# Patient Record
Sex: Male | Born: 1970 | Race: White | Hispanic: No | Smoking: Current every day smoker
Health system: Southern US, Community
[De-identification: ages and names within clinical notes are randomized; demographics above are authoritative.]

## PROBLEM LIST (undated history)

## (undated) DIAGNOSIS — Z8739 Personal history of other diseases of the musculoskeletal system and connective tissue: Secondary | ICD-10-CM

## (undated) DIAGNOSIS — F329 Major depressive disorder, single episode, unspecified: Secondary | ICD-10-CM

## (undated) DIAGNOSIS — F32A Depression, unspecified: Secondary | ICD-10-CM

## (undated) HISTORY — PX: BACK SURGERY: SHX140

---

## 1997-11-29 ENCOUNTER — Encounter: Admission: RE | Admit: 1997-11-29 | Discharge: 1997-11-29 | Payer: Self-pay | Admitting: Family Medicine

## 2000-11-18 ENCOUNTER — Encounter: Payer: Self-pay | Admitting: Neurosurgery

## 2000-11-18 ENCOUNTER — Encounter: Admission: RE | Admit: 2000-11-18 | Discharge: 2000-11-18 | Payer: Self-pay | Admitting: Neurosurgery

## 2000-12-02 ENCOUNTER — Encounter: Payer: Self-pay | Admitting: Neurosurgery

## 2000-12-02 ENCOUNTER — Encounter: Admission: RE | Admit: 2000-12-02 | Discharge: 2000-12-02 | Payer: Self-pay | Admitting: Neurosurgery

## 2000-12-16 ENCOUNTER — Encounter: Admission: RE | Admit: 2000-12-16 | Discharge: 2000-12-16 | Payer: Self-pay | Admitting: Neurosurgery

## 2000-12-16 ENCOUNTER — Encounter: Payer: Self-pay | Admitting: Neurosurgery

## 2003-06-19 ENCOUNTER — Other Ambulatory Visit: Payer: Self-pay

## 2004-01-15 ENCOUNTER — Other Ambulatory Visit: Payer: Self-pay

## 2004-02-21 ENCOUNTER — Emergency Department (HOSPITAL_COMMUNITY): Admission: EM | Admit: 2004-02-21 | Discharge: 2004-02-22 | Payer: Self-pay | Admitting: *Deleted

## 2004-05-06 ENCOUNTER — Emergency Department: Payer: Self-pay | Admitting: Emergency Medicine

## 2004-06-13 ENCOUNTER — Encounter: Admission: RE | Admit: 2004-06-13 | Discharge: 2004-06-13 | Payer: Self-pay | Admitting: Neurosurgery

## 2004-06-19 ENCOUNTER — Encounter: Admission: RE | Admit: 2004-06-19 | Discharge: 2004-06-19 | Payer: Self-pay | Admitting: Neurosurgery

## 2004-07-02 ENCOUNTER — Encounter: Admission: RE | Admit: 2004-07-02 | Discharge: 2004-07-02 | Payer: Self-pay | Admitting: Neurosurgery

## 2004-08-08 ENCOUNTER — Ambulatory Visit (HOSPITAL_COMMUNITY): Admission: RE | Admit: 2004-08-08 | Discharge: 2004-08-08 | Payer: Self-pay | Admitting: Neurosurgery

## 2005-03-11 ENCOUNTER — Encounter: Admission: RE | Admit: 2005-03-11 | Discharge: 2005-03-11 | Payer: Self-pay | Admitting: Neurosurgery

## 2006-01-23 ENCOUNTER — Encounter: Admission: RE | Admit: 2006-01-23 | Discharge: 2006-01-23 | Payer: Self-pay | Admitting: Neurosurgery

## 2008-02-21 ENCOUNTER — Emergency Department: Payer: Self-pay | Admitting: Emergency Medicine

## 2008-02-21 ENCOUNTER — Other Ambulatory Visit: Payer: Self-pay

## 2008-10-25 ENCOUNTER — Ambulatory Visit: Payer: Self-pay | Admitting: Family Medicine

## 2008-10-25 DIAGNOSIS — K029 Dental caries, unspecified: Secondary | ICD-10-CM | POA: Insufficient documentation

## 2008-10-25 DIAGNOSIS — K921 Melena: Secondary | ICD-10-CM

## 2008-10-25 DIAGNOSIS — F3289 Other specified depressive episodes: Secondary | ICD-10-CM | POA: Insufficient documentation

## 2008-10-25 DIAGNOSIS — E785 Hyperlipidemia, unspecified: Secondary | ICD-10-CM

## 2008-10-25 DIAGNOSIS — G43009 Migraine without aura, not intractable, without status migrainosus: Secondary | ICD-10-CM | POA: Insufficient documentation

## 2008-10-25 DIAGNOSIS — K649 Unspecified hemorrhoids: Secondary | ICD-10-CM | POA: Insufficient documentation

## 2008-10-25 DIAGNOSIS — I10 Essential (primary) hypertension: Secondary | ICD-10-CM | POA: Insufficient documentation

## 2008-10-25 DIAGNOSIS — M549 Dorsalgia, unspecified: Secondary | ICD-10-CM | POA: Insufficient documentation

## 2008-10-25 DIAGNOSIS — F329 Major depressive disorder, single episode, unspecified: Secondary | ICD-10-CM

## 2008-10-25 DIAGNOSIS — R011 Cardiac murmur, unspecified: Secondary | ICD-10-CM | POA: Insufficient documentation

## 2008-11-15 ENCOUNTER — Ambulatory Visit: Payer: Self-pay | Admitting: Family Medicine

## 2008-11-15 LAB — CONVERTED CEMR LAB
ALT: 28 units/L (ref 0–53)
Alkaline Phosphatase: 76 units/L (ref 39–117)
Calcium: 9 mg/dL (ref 8.4–10.5)
Chloride: 105 meq/L (ref 96–112)
Creatinine, Ser: 0.9 mg/dL (ref 0.4–1.5)
Eosinophils Absolute: 0.2 10*3/uL (ref 0.0–0.7)
Glucose, Bld: 91 mg/dL (ref 70–99)
HDL: 31 mg/dL — ABNORMAL LOW (ref >39.00)
Hemoglobin: 13.4 g/dL (ref 13.0–17.0)
Lymphocytes Relative: 32 % (ref 12.0–46.0)
Monocytes Absolute: 0.6 10*3/uL (ref 0.1–1.0)
Monocytes Relative: 8.2 % (ref 3.0–12.0)
Neutro Abs: 4.2 10*3/uL (ref 1.4–7.7)
Neutrophils Relative %: 56.9 % (ref 43.0–77.0)
Potassium: 3.8 meq/L (ref 3.5–5.1)
RBC: 4.19 M/uL — ABNORMAL LOW (ref 4.22–5.81)
TSH: 1.58 microintl units/mL (ref 0.35–5.50)
Total Bilirubin: 0.4 mg/dL (ref 0.3–1.2)
Total CHOL/HDL Ratio: 5
Total Protein: 6.6 g/dL (ref 6.0–8.3)
VLDL: 31 mg/dL (ref 0.0–40.0)

## 2008-11-19 ENCOUNTER — Ambulatory Visit: Payer: Self-pay | Admitting: Family Medicine

## 2009-07-12 ENCOUNTER — Ambulatory Visit: Payer: Self-pay | Admitting: Family Medicine

## 2009-08-05 ENCOUNTER — Ambulatory Visit: Payer: Self-pay | Admitting: Family Medicine

## 2009-08-05 DIAGNOSIS — IMO0002 Reserved for concepts with insufficient information to code with codable children: Secondary | ICD-10-CM

## 2009-08-09 ENCOUNTER — Telehealth: Payer: Self-pay | Admitting: Family Medicine

## 2009-08-10 ENCOUNTER — Emergency Department (HOSPITAL_COMMUNITY): Admission: EM | Admit: 2009-08-10 | Discharge: 2009-08-10 | Payer: Self-pay | Admitting: Emergency Medicine

## 2009-08-12 ENCOUNTER — Ambulatory Visit: Payer: Self-pay | Admitting: Family Medicine

## 2009-08-13 ENCOUNTER — Encounter: Admission: RE | Admit: 2009-08-13 | Discharge: 2009-08-13 | Payer: Self-pay | Admitting: Family Medicine

## 2009-08-13 LAB — CONVERTED CEMR LAB
BUN: 8 mg/dL (ref 6–23)
Creatinine, Ser: 0.9 mg/dL (ref 0.4–1.5)

## 2009-08-19 ENCOUNTER — Ambulatory Visit: Payer: Self-pay | Admitting: Family Medicine

## 2009-08-22 ENCOUNTER — Encounter: Payer: Self-pay | Admitting: Family Medicine

## 2009-10-01 ENCOUNTER — Emergency Department: Payer: Self-pay | Admitting: Emergency Medicine

## 2010-04-22 ENCOUNTER — Ambulatory Visit: Payer: Self-pay | Admitting: Family Medicine

## 2010-04-22 DIAGNOSIS — K5900 Constipation, unspecified: Secondary | ICD-10-CM | POA: Insufficient documentation

## 2010-04-22 DIAGNOSIS — R109 Unspecified abdominal pain: Secondary | ICD-10-CM | POA: Insufficient documentation

## 2010-04-22 LAB — CONVERTED CEMR LAB
Cholesterol, target level: 200 mg/dL
LDL Goal: 130 mg/dL

## 2010-04-23 ENCOUNTER — Encounter (INDEPENDENT_AMBULATORY_CARE_PROVIDER_SITE_OTHER): Payer: Self-pay | Admitting: *Deleted

## 2010-04-28 LAB — CONVERTED CEMR LAB
ALT: 22 units/L (ref 0–53)
Albumin: 4 g/dL (ref 3.5–5.2)
CO2: 27 meq/L (ref 19–32)
Creatinine, Ser: 0.9 mg/dL (ref 0.4–1.5)
GFR calc non Af Amer: 103.98 mL/min (ref >60)
Glucose, Bld: 85 mg/dL (ref 70–99)
HCT: 36.7 % — ABNORMAL LOW (ref 39.0–52.0)
Lipase: 24 units/L (ref 11.0–59.0)
Monocytes Absolute: 0.6 10*3/uL (ref 0.1–1.0)
Monocytes Relative: 11 % (ref 3.0–12.0)
Neutro Abs: 3.1 10*3/uL (ref 1.4–7.7)
Neutrophils Relative %: 53.9 % (ref 43.0–77.0)
Platelets: 218 10*3/uL (ref 150.0–400.0)
Potassium: 3.9 meq/L (ref 3.5–5.1)
Sodium: 140 meq/L (ref 135–145)
Total Bilirubin: 0.3 mg/dL (ref 0.3–1.2)
Total Protein: 6.7 g/dL (ref 6.0–8.3)

## 2010-05-21 ENCOUNTER — Ambulatory Visit: Payer: Self-pay | Admitting: Family Medicine

## 2010-07-19 ENCOUNTER — Encounter: Payer: Self-pay | Admitting: Neurosurgery

## 2010-07-31 NOTE — Assessment & Plan Note (Signed)
Summary: SINUS INFECTION. CYD   Vital Signs:  Patient profile:   40 year old male Height:      70 inches Weight:      172.0 pounds BMI:     24.77 Temp:     98.8 degrees F oral Pulse rate:   80 / minute Pulse rhythm:   regular BP sitting:   120 / 84  (left arm) Cuff size:   regular  History of Present Illness: Chief complaint sinus infection  Acute Visit History:      The patient complains of cough, headache, and sinus problems.  These symptoms began 1 week ago.  He denies earache and fever.  Other comments include: left face swollen.  Teeth are bad...but no specific oral swelling and pain...feels like sack behind upper lip with fluid from sweling looking into getting dentures Took 500 mg three daily x 2-3 days.        The patient notes shortness of breath.  The character of the cough is described as nonproductive, post nasal drip.  There is no history of wheezing associated with his cough.        He complains of sinus pressure, ears being blocked, nasal congestion, purulent drainage, and epistaxis.  The patient has had a past history of sinusitis.  He denies previous sinus surgery or sinusitis in the last 2 months.        Problems Prior to Update: 1)  Health Maintenance Exam  (ICD-V70.0) 2)  Dental Caries  (ICD-521.00) 3)  Hemorrhoids  (ICD-455.6) 4)  Back Pain  (ICD-724.5) 5)  Family History of Alcoholism/addiction  (ICD-V61.41) 6)  Migraine, Common  (ICD-346.10) 7)  Cardiac Murmur  (ICD-785.2) 8)  Blood in Stool  (ICD-578.1) 9)  Hypertension  (ICD-401.9) 10)  Hyperlipidemia  (ICD-272.4) 11)  Headache  (ICD-784.0) 12)  Depression  (ICD-311)  Current Medications (verified): 1)  Hydrocortisone Acetate 25 Mg Supp (Hydrocortisone Acetate) .... Use Up To 4 Times A Day During Hemorrhoid Flares 2)  Amoxicillin-Pot Clavulanate 875-125 Mg Tabs (Amoxicillin-Pot Clavulanate) .Marland Kitchen.. 1 Tab By Mouth Two Times A Day X 10 Days  Allergies (verified): No Known Drug Allergies  Past  History:  Past medical, surgical, family and social histories (including risk factors) reviewed, and no changes noted (except as noted below).  Past Medical History: Reviewed history from 10/25/2008 and no changes required. MIGRAINE HYPERTENSION HYPERLIPIDEMIA  DEPRESSION Chronic back pain Degenerative Disk Disease Hemorrhoids   Former Dr. Sherrie Mustache patient, [facility]  Past Surgical History: Reviewed history from 10/25/2008 and no changes required. Back Surgery, Partial disectomy, L4 or L5 Epidural steroid injections, several rounds  Family History: Reviewed history from 10/25/2008 and no changes required. Family History of Alcoholism/Addiction Family History of Arthritis Family History Hypertension Family History Lung cancer Family History of Cardiovascular disorder  Social History: Reviewed history from 10/25/2008 and no changes required. Unemployed, plays a lot of online video games Married Current Smoker, > 50 pack year history Alcohol use-yes Regular exercise-no  Review of Systems General:  Denies fatigue and fever. Eyes:  Denies discharge and double vision. ENT:  Complains of postnasal drainage. CV:  Denies chest pain or discomfort. GI:  Denies abdominal pain. GU:  Denies dysuria.  Physical Exam  General:  thin appearing male smells of smoke Head:  B facial pain, ttp over left upper lip and jaw.  Ears:  clear TMs B Nose:  nasal discharge, no mucosal pallor.   Mouth:  MMM, very poor dentiotion, almost all teeth broken off, very tender  over upper gums, swollen Lungs:  Lung sounds tympanic, prolongued exp phase,  no ronchi localized, no rales Heart:  Normal rate and regular rhythm. S1 and S2 normal without gallop, murmur, click, rub or other extra sounds.   Impression & Recommendations:  Problem # 1:  ACUTE MAXILLARY SINUSITIS (ICD-461.0) Antibiotics, mucinex, nasal saline irrigation His updated medication list for this problem includes:    Amoxicillin-pot  Clavulanate 875-125 Mg Tabs (Amoxicillin-pot clavulanate) .Marland Kitchen... 1 tab by mouth two times a day x 10 days  Instructed on treatment. Call if symptoms persist or worsen.   Problem # 2:  DENTAL CARIES (ICD-521.00) Very tender over upper gum anterior..? abcess. Broad antibitoics to treat es well.   Problem # 3:  ? of COPD (ICD-496) Likely given lung exam and smoking history. Counseled on smoking cessation. Rec follow up with primary to eval further.Marland KitchenPFTS etc. No ongoing exacerbation.   Complete Medication List: 1)  Hydrocortisone Acetate 25 Mg Supp (Hydrocortisone acetate) .... Use up to 4 times a day during hemorrhoid flares 2)  Amoxicillin-pot Clavulanate 875-125 Mg Tabs (Amoxicillin-pot clavulanate) .Marland Kitchen.. 1 tab by mouth two times a day x 10 days  Patient Instructions: 1)  MAke follow up appt with Dr. Patsy Lager for further eval of lung function.  2)  MAke appt with dentist if  upper gum swelling not improved...consider [facility] for tooth removal. Prescriptions: AMOXICILLIN-POT CLAVULANATE 875-125 MG TABS (AMOXICILLIN-POT CLAVULANATE) 1 tab by mouth two times a day x 10 days  #20 x 0   Entered and Authorized by:   Kerby Nora MD   Signed by:   Kerby Nora MD on 07/12/2009   Method used:   Electronically to        [facility]  [address] (retail)       [address]       [place], [place]  [address]       Ph: [phone] or [phone]       Fax: [phone]   RxID:   [id number]   Current Allergies (reviewed today): No known allergies

## 2010-07-31 NOTE — Assessment & Plan Note (Signed)
Summary: depression and back pain   Vital Signs:  Patient profile:   40 year old male Weight:      172.38 pounds BMI:     24.82 Temp:     98.2 degrees F oral Pulse rate:   80 / minute Pulse rhythm:   regular BP sitting:   120 / 80  (left arm) Cuff size:   regular  Vitals Entered By: Linde Gillis CMA Duncan Dull) (August 05, 2009 10:39 AM) CC: referral, depression, Depression   History of Present Illness: 40 year old male:  Still having a lot of problems with his, will have some numbness or tingling in both legs, will completely numb. Essentially intractable pain s/p L4-5 partial diskectomy s/p rounds of epidural injections. Was lost to NSG follow-up.  Dr. Dutch Quint did surgery initially.   Back is constantly bothering him, has been looking for a job, having problems with his wife, does not want to have problems with his wife. Having trouble doing things with his kids. Sleep will be really variable. Some crying spells.     Depression History:      The patient is having a depressed mood most of the day and has a diminished interest in his usual daily activities.  Positive alarm features for depression include insomnia, psychomotor retardation, fatigue (loss of energy), feelings of worthlessness (guilt), and recurrent thoughts of death or suicide.  However, he denies significant weight loss, significant weight gain, hypersomnia, and psychomotor agitation.  The patient denies symptoms of a manic disorder including persistently & abnormally elevated mood, abnormally & persistently irritable mood, less need for sleep, talkative or feels need to keep talking, distractibility, flight of ideas, increase in goal-directed activity, psychomotor agitation, inflated self-esteem or grandiosity, excessive buying sprees, excessive sexual indiscretions, and excessive foolish business investments.        Psychosocial stress factors include major life changes.  Risk factors for depression include chronic illness.   Suicide risk questions reveal that he has thought about ending his life.  The patient denies that he feels like life is not worth living, denies that he wishes that he were dead, and denies that he has planned how to end his life.  His current symptoms often keep him from doing the things he needs to do.        Comments:  few fleeting thoughts of SI, none now.  Allergies (verified): No Known Drug Allergies  Past History:  Past medical, surgical, family and social histories (including risk factors) reviewed, and no changes noted (except as noted below).  Past Medical History: Reviewed history from 10/25/2008 and no changes required. MIGRAINE HYPERTENSION HYPERLIPIDEMIA  DEPRESSION Chronic back pain Degenerative Disk Disease Hemorrhoids   Former Dr. Sherrie Mustache patient, [facility]  Past Surgical History: Reviewed history from 10/25/2008 and no changes required. Back Surgery, Partial disectomy, L4 or L5 Epidural steroid injections, several rounds  Family History: Reviewed history from 10/25/2008 and no changes required. Family History of Alcoholism/Addiction Family History of Arthritis Family History Hypertension Family History Lung cancer Family History of Cardiovascular disorder  Social History: Reviewed history from 10/25/2008 and no changes required. Unemployed, plays a lot of online video games Married Current Smoker, > 50 pack year history Alcohol use-yes Regular exercise-no  Review of Systems       ROS above no acute fevers, chills, sweats, n, v, d, cough, or uri signs   Physical Exam  General:  Well-developed,well-nourished,in no acute distress; alert,appropriate and cooperative throughout examination Head:  Normocephalic and atraumatic  without obvious abnormalities. No apparent alopecia or balding. Ears:  External ear exam shows no significant lesions or deformities.  Otoscopic examination reveals clear canals, tympanic membranes are intact bilaterally without bulging,  retraction, inflammation or discharge. Hearing is grossly normal bilaterally. Nose:  no external deformity.   Mouth:  MMM, very poor dentiotion, almost all teeth broken off Neck:  No deformities, masses, or tenderness noted. Lungs:  Normal respiratory effort, chest expands symmetrically. Lungs are clear to auscultation, no crackles or wheezes. Heart:  Normal rate and regular rhythm. S1 and S2 normal without gallop, murmur, click, rub or other extra sounds.   Impression & Recommendations:  Problem # 1:  DEPRESSION (ICD-311) Assessment Deteriorated >25 minutes spent in face to face time with patient, >50% spent in counselling or coordination of care: decompensated, unemployed, money issues, problems with wife. Chronic back pain, depressed much of this time and crying intermittently. Fleeting thoughts of SI, but none now. Discussed social stressors in much detial. Start SSRI, for now benzo to help acutely with close f/u. Recheck in 2 weeks  His updated medication list for this problem includes:    Citalopram Hydrobromide 20 Mg Tabs (Citalopram hydrobromide) .Marland Kitchen... 1 by mouth daily    Clonazepam 0.5 Mg Tbdp (Clonazepam) .Marland Kitchen... 1 by mouth two times a day as needed anxiety  Problem # 2:  LUMBAR RADICULOPATHY (ICD-724.4) Assessment: Deteriorated Chronic symptoms, anatomy needs to be evaluated. Will ask Dr. Ethelene Browns group to see patient and will obtain an MRI with contrast if they agree to see him.  If not, will refer to [facility] I do not expect adequate response to conservative management in this case.  Orders: Neurosurgeon Referral (Neurosurgeon)  Problem # 3:  BACK PAIN (ICD-724.5)  Orders: Neurosurgeon Referral (Neurosurgeon)  Complete Medication List: 1)  Hydrocortisone Acetate 25 Mg Supp (Hydrocortisone acetate) .... Use up to 4 times a day during hemorrhoid flares 2)  Citalopram Hydrobromide 20 Mg Tabs (Citalopram hydrobromide) .Marland Kitchen.. 1 by mouth daily 3)  Clonazepam 0.5 Mg Tbdp (Clonazepam)  .Marland Kitchen.. 1 by mouth two times a day as needed anxiety  Patient Instructions: 1)  follow-up with me in 2 weeks 2)  Referral Appointment Information 3)  Day/Date: 4)  Time: 5)  Place/MD: 6)  Address: 7)  Phone/Fax: 8)  Patient given appointment information. Information/Orders faxed/mailed.  Prescriptions: CLONAZEPAM 0.5 MG TBDP (CLONAZEPAM) 1 by mouth two times a day as needed anxiety  #30 x 0   Entered and Authorized by:   Hannah Beat MD   Signed by:   Hannah Beat MD on 08/05/2009   Method used:   Print then Give to Patient   RxID:   [id number] CITALOPRAM HYDROBROMIDE 20 MG TABS (CITALOPRAM HYDROBROMIDE) 1 by mouth daily  #30 x 3   Entered and Authorized by:   Hannah Beat MD   Signed by:   Hannah Beat MD on 08/05/2009   Method used:   Print then Give to Patient   RxID:   [id number]   Current Allergies (reviewed today): No known allergies

## 2010-07-31 NOTE — Assessment & Plan Note (Signed)
Summary: NOT EATING AND FEET AND KNEES SWELLING/DLO   Vital Signs:  Patient profile:   40 year old male Height:      70 inches Weight:      177 pounds BMI:     25.49 Temp:     98 degrees F oral Pulse rate:   76 / minute Pulse rhythm:   regular BP sitting:   116 / 84  (left arm) Cuff size:   regular  Vitals Entered By: Delilah Shan CMA  Dull) (April 22, 2010 9:17 AM) CC: Not eating; Feet & Knees swelling, Lipid Management   History of Present Illness: "My mom was asking me to come in."  "I had a week w/o an appetite.  I would still eat but I didn't have a bowel movement until Sunday.  It's like everything went at once."  That was about 1.5 weeks ago.  Since then, BMs have been more regular.  Now eating 1-2 times a day.  Has some tightness on the R side of the abdominal wall - "it feels like a pulled muscle."  No trauma to explain the pain.  More often at night.  Not losing weight. H/o heartburn.    H/o blood in stool with known hemorrhoid.  BRBPR.  No vomiting.  No nausea.    H/o bilateral lower extremity edema about 2 years ago.  "It's not as bad yet."  Prev when it happened, it resolved by the time the patient got to the clinic.  H/o bursitis in bilateral knees.  No knee pain.  "But I'm putting on weight and that may be it."    Taking ASA daily (81mg ) and occ taking excedrin migraine (with ASA in it).    Minimal alcohol per patient.    Allergies: No Known Drug Allergies  Past History:  Past Medical History: MIGRAINE HYPERTENSION HYPERLIPIDEMIA  DEPRESSION Chronic back pain Degenerative Disk Disease Hemorrhoids Former Dr. Sherrie Mustache patient, [facility]  Review of Systems       See HPI.  Otherwise negative.    Physical Exam  General:  RUE:AVWU appearing, nad, alert and oriented HEENT: mucous membranes moist, poor dentition NECK: supple w/o LA CV: rrr.  no murmur PULM: ctab, no inc wob ABD: soft, +bs, R hemiabd tender to palpation but this is reproduced on rectus  testing. no rebound EXT: no edema, knees w/o edema or tenderness SKIN: no acute rash  Rectal with hemorrhoid noted externally.    Impression & Recommendations:  Problem # 1:  ABDOMINAL PAIN (ICD-789.00) I think this is likely a combination of resolved constipation and abdominal wall strain.  See notes on labs.  Orders: TLB-BMP (Basic Metabolic Panel-BMET) (80048-METABOL) TLB-Hepatic/Liver Function Pnl (80076-HEPATIC) TLB-Amylase (82150-AMYL) TLB-Lipase (83690-LIPASE)  Problem # 2:  BLOOD IN STOOL (ICD-578.1) See notes on labs.  Orders: TLB-CBC Platelet - w/Differential (85025-CBCD)  Problem # 3:  CONSTIPATION (ICD-564.00) resolved.   Complete Medication List: 1)  Aspirin 81 Mg Tabs (Aspirin) .... Take 1 tablet by mouth once a day 2)  Tylenol 325 Mg Tabs (Acetaminophen) .... 2 by mouth three times a day as needed for pain 3)  [facility] Omeprazole 20 Mg Tbec (Omeprazole) .Marland Kitchen.. 1 by mouth qday  Other Orders: Admin 1st Vaccine (98119) Flu Vaccine 21yrs + (14782)  Patient Instructions: 1)  You may have stomach irritation due to the aspirin.  I would cut down on the aspirin and take omeprazole (1 a day) in the meantime.  You can get your results through our phone system.  Follow  the instructions on the blue card.  I think you have a pulled muscle on the right side of your stomach.  Let me know if you continue to have symptoms.  Take care.    Orders Added: 1)  Admin 1st Vaccine [90471] 2)  Flu Vaccine 69yrs + [90658] 3)  Est. Patient Level III [[address]] 4)  TLB-CBC Platelet - w/Differential [85025-CBCD] 5)  TLB-BMP (Basic Metabolic Panel-BMET) [80048-METABOL] 6)  TLB-Hepatic/Liver Function Pnl [80076-HEPATIC] 7)  TLB-Amylase [82150-AMYL] 8)  TLB-Lipase [83690-LIPASE]    Current Allergies (reviewed today): No known allergies Flu Vaccine Consent Questions     Do you have a history of severe allergic reactions to this vaccine? no    Any prior history of allergic reactions to egg  and/or gelatin? no    Do you have a sensitivity to the preservative Thimersol? no    Do you have a past history of Guillan-Barre Syndrome? no    Do you currently have an acute febrile illness? no    Have you ever had a severe reaction to latex? no    Vaccine information given and explained to patient? yes    Are you currently pregnant? no    Lot Number:[id number]   Exp Date:12/27/2010   Site Given  Left Deltoid IMes (reviewed today): No known allergies          .lbflu

## 2010-07-31 NOTE — Assessment & Plan Note (Signed)
Summary: 2 WEEK FOLLOW UP/[facility]   Vital Signs:  Patient profile:   40 year old male Height:      70 inches Weight:      174.4 pounds BMI:     25.11 Temp:     98.2 degrees F oral Pulse rate:   80 / minute Pulse rhythm:   regular BP sitting:   120 / 70  (left arm) Cuff size:   regular  Vitals Entered By: Benny Lennert CMA Duncan Dull) (August 19, 2009 8:59 AM)  History of Present Illness: Chief complaint 2 week followup  f/u Depression:  back - Dr. Dutch Quint  currently, the patient states that he is feeling okay. Proximally 3 days after I initially saw him a couple of weeks ago, on a Friday night, the patient was drinking some alcohol,, so only took a Klonopin,  and  eventually  he took the remainder of his  bottle of Klonopin. He tells me that he had no interest in committing suicide, and does not really understand  how this happened.  He was taken to the ER, he'll there for a number of hours, but he was discharged.  took bottle of Klonopin.  Had drank 2 beers, then took a whole bottle  mood is 200% better now.   currently he denies any suicidality or homicidality.  Allergies (verified): No Known Drug Allergies  Past History:  Past medical, surgical, family and social histories (including risk factors) reviewed, and no changes noted (except as noted below).  Past Medical History: Reviewed history from 10/25/2008 and no changes required. MIGRAINE HYPERTENSION HYPERLIPIDEMIA  DEPRESSION Chronic back pain Degenerative Disk Disease Hemorrhoids   Former Dr. Sherrie Mustache patient, [facility]  Past Surgical History: Reviewed history from 10/25/2008 and no changes required. Back Surgery, Partial disectomy, L4 or L5 Epidural steroid injections, several rounds  Family History: Reviewed history from 10/25/2008 and no changes required. Family History of Alcoholism/Addiction Family History of Arthritis Family History Hypertension Family History Lung cancer Family History of Cardiovascular  disorder  Social History: Reviewed history from 10/25/2008 and no changes required. Unemployed, plays a lot of online video games Married Current Smoker, > 50 pack year history Alcohol use-yes Regular exercise-no   Impression & Recommendations:  Problem # 1:  DEPRESSION (ICD-311) >25 minutes spent in face to face time with patient, >50% spent in counselling or coordination of care: I find this history to be highly concerning. He is currently not suicidal or homicidal, and is not committable.  He is still depressed, though he is better. This patient is not forthcoming  in discussion, and I am highly concerned about him. I have arranged for him to see Dr. Imogene Burn in follow-up on Thursday, and I think psychiatric involvement in his care is needed. He is feeling a little better now, more likely due to CItalopram effect at 2 weeks, but this patient's ability to fully express his moods to me I think is not fully there.  The following medications were removed from the medication list:    Clonazepam 0.5 Mg Tbdp (Clonazepam) .Marland Kitchen... 1 by mouth two times a day as needed anxiety His updated medication list for this problem includes:    Citalopram Hydrobromide 20 Mg Tabs (Citalopram hydrobromide) .Marland Kitchen... 1 by mouth daily  Complete Medication List: 1)  Hydrocortisone Acetate 25 Mg Supp (Hydrocortisone acetate) .... Use up to 4 times a day during hemorrhoid flares 2)  Citalopram Hydrobromide 20 Mg Tabs (Citalopram hydrobromide) .Marland Kitchen.. 1 by mouth daily 3)  Vicodin 5-500 Mg  Tabs (Hydrocodone-acetaminophen) .Marland Kitchen.. 1-2 every 4-6 hours 4)  Corisodoldol  5)  Prednisone (pak) 10 Mg Tabs (Prednisone) .... Tapper  Other Orders: Psychiatric Referral (Psych)  Patient Instructions: 1)  Referral Appointment Information 2)  Day/Date: 3)  Time: 4)  Place/MD: 5)  Address: 6)  Phone/Fax: 7)  Patient given appointment information. Information/Orders faxed/mailed.   Current Allergies (reviewed today): No known  allergies

## 2010-07-31 NOTE — Progress Notes (Signed)
  Phone Note Call from Patient   Caller: Patient Summary of Call: Patient called to say that he has an appt with Dr Dutch Quint on Thursday 08/15/2009 at 10:45am. Dr Shirlean Mylar office asked the patient to see if you could schedule the patient for an MRI before his appt on thursday. Told him we would call him back when Dr Faline Langer is back in the office on monday 08/12/2009 that Dr Patsy Lager had already said he would order this test if they needed him to. Initial call taken by: Butch Adam,  August 09, 2009 4:44 PM  Follow-up for Phone Call        I agree that this needs to be evaluated with MRI prior to neurosurgical evaluation with clear radiculopathy, neuro findings, and prior spine surgery.   Follow-up by: Hannah Beat MD,  August 11, 2009 6:23 PM

## 2010-07-31 NOTE — Letter (Signed)
Summary: Generic Letter  [place] [facility]  [address] [place], [place] [address]   Phone: [phone]  Fax: [phone]    04/23/2010    Noah Avery [address] [place], [place]  [address]    Dear Mr. Puff,  We have been unable to reach you by phone.  If your phone number has changed, please notify our office as it is important that we be able to contact  you if necessary.   All  of your labs are fine except for mild decrease in blood counts.  I would take the Omeprazole, decrease the asprin, and recheck your blood in 1 month.  Let me know if you have further symptoms in the meantime.    I think that with the Omeprazole and resolved conspitation, your  symptoms will improve.        Sincerely,   Dwana Curd. Para March, M.D.  [facility]

## 2010-07-31 NOTE — Consult Note (Signed)
Summary: Genevieve Norlander MD  Genevieve Norlander MD   Imported By: Lanelle Bal 08/28/2009 12:20:13  _____________________________________________________________________  External Attachment:    Type:   Image     Comment:   External Document

## 2010-09-17 LAB — BASIC METABOLIC PANEL
CO2: 26 mEq/L (ref 19–32)
Creatinine, Ser: 0.79 mg/dL (ref 0.4–1.5)
GFR calc non Af Amer: >60 mL/min (ref >60)
Glucose, Bld: 84 mg/dL (ref 70–99)
Potassium: 3.6 mEq/L (ref 3.5–5.1)

## 2010-09-17 LAB — CBC
HCT: 41.2 % (ref 39.0–52.0)
MCHC: 34.1 g/dL (ref 30.0–36.0)
Platelets: 176 10*3/uL (ref 150–400)
RBC: 4.53 MIL/uL (ref 4.22–5.81)
WBC: 8.7 10*3/uL (ref 4.0–10.5)

## 2010-09-17 LAB — RAPID URINE DRUG SCREEN, HOSP PERFORMED
Amphetamines: NOT DETECTED
Barbiturates: NOT DETECTED
Benzodiazepines: NOT DETECTED
Cocaine: NOT DETECTED
Opiates: NOT DETECTED

## 2010-09-17 LAB — DIFFERENTIAL
Basophils Relative: 1 % (ref 0–1)
Eosinophils Absolute: 0.2 10*3/uL (ref 0.0–0.7)
Lymphocytes Relative: 37 % (ref 12–46)
Monocytes Absolute: 0.5 10*3/uL (ref 0.1–1.0)
Neutro Abs: 4.7 10*3/uL (ref 1.7–7.7)

## 2010-09-17 LAB — ACETAMINOPHEN LEVEL: Acetaminophen (Tylenol), Serum: <10 ug/mL — ABNORMAL LOW (ref 10–30)

## 2010-09-17 LAB — SALICYLATE LEVEL: Salicylate Lvl: <4 mg/dL (ref 2.8–20.0)

## 2010-09-17 LAB — ETHANOL: Alcohol, Ethyl (B): 154 mg/dL — ABNORMAL HIGH (ref 0–10)

## 2010-11-14 NOTE — Op Note (Signed)
NAMEKASSEM, KIBBE NO.:  [id number]   MEDICAL RECORD NO.:  [id number]          PATIENT TYPE:  OIB   LOCATION:  3038                         FACILITY:  [facility]   PHYSICIAN:  Kathaleen Maser. Pool, M.D.    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   PROCEDURE:  Right L4-5 laminotomy and microdiscectomy.   SURGEON:  Kathaleen Maser. Pool, M.D.   ASSISTANT:  Donalee Citrin, M.D.   ANESTHESIA:  General anesthesia.   INDICATIONS FOR PROCEDURE:  Mr. Ehle is a 40 year old male with history of  back and bilateral lower extremity pain, alternating between the left and  right.  Patient has failed conservative management.  Work-up demonstrates  evidence of a significant right paracentral disc herniation at L4-5.  We  discussed options of operative management including the possibility to  undergoing a right-sided L4-5 laminotomy and microdiscectomy in hopes of  improving his symptoms.  The patient is aware of the risks and benefits and  wishes to proceed.   Preoperatively the patient's films are not available for review while the  patient was in the office and incorrectly something had been transposed and  we initially talked about doing the procedure on the left side.  This error  was corrected prior to the day of surgery.  The patient fully understood  that we were going in from the right side and had been consented  appropriately.   DESCRIPTION OF PROCEDURE:  The patient taken to the operating room and  placed on the table in the supine position.  Local anesthesia achieved.  Patient was positioned prone on the Wilson frame, appropriately padded with  _________ prepped and draped sterilely.  ________ linear skin incision  overlying the L4-5 interspace.  This was carried down sharply in the  midline.  Subperiosteal dissection then performed to the right side,  exposing the laminar facet joints at L4 and L5.  Deep self-retaining  retractor placed.  Intraoperative x-ray taken.  Level was confirmed.  Laminotomy was then performed using high speed drill and Kerrison rongeurs  to remove the inferior aspect of the lamina of L4, medial aspect of the L4-5  facet joint and superior rim of the L5 lamina.  Ligamentum flavum was then  elevated and resected piecemeal fashion using Kerrison rongeurs.  Underlying  thecal sac and exiting L5 nerve root were identified.  Microscope was  brought into the field using microdissection of the right sided L5 nerve  root and underlying disc herniation.  The epineural venous plexus coagulated  and cut.  Thecal sac and L5 nerve root were protected.  Disc space was then  incised in a rectangular fashion using a 15 blade.  A wide disc space clean  out was then achieved using pituitary rongeurs, upper angled pituitary  rongeurs and Epstein curettes to fully remove the disc herniation as  well as  any loose or obviously degenerative disc disease from within the interspace.  After a very thorough discectomy had been performed, the spinal canal was  inspected.  There was no evidence of injury to thecal sac and nerve roots.  There was no evidence of any residual compression to the thecal sac or nerve  roots.  Wound was then irrigated in _________ solution.  Gelfoam was placed  topically for hemostasis and found to be good.  Microscope and retraction system were removed.  Hemostasis was achieved with  electrocautery.  Wound was closed in layers with Vicryl sutures, Steri-  Strips and sterile dressing were applied.  There were no apparent  complications.  The patient tolerated the procedure well and he returns to  the recovery room postoperatively.      HAP/MEDQ  D:  08/08/2004  T:   08/08/2004  Job:  [id number]

## 2012-03-08 ENCOUNTER — Emergency Department: Payer: Self-pay | Admitting: Unknown Physician Specialty

## 2012-03-08 LAB — CBC
HGB: 14.4 g/dL (ref 13.0–18.0)
MCH: 30.9 pg (ref 26.0–34.0)
MCHC: 33.7 g/dL (ref 32.0–36.0)
Platelet: 204 10*3/uL (ref 150–440)
RBC: 4.67 10*6/uL (ref 4.40–5.90)

## 2012-03-08 LAB — TSH: Thyroid Stimulating Horm: 1.27 u[IU]/mL

## 2012-03-08 LAB — BASIC METABOLIC PANEL
Calcium, Total: 8.9 mg/dL (ref 8.5–10.1)
Chloride: 107 mmol/L (ref 98–107)
EGFR (Non-African Amer.): >60
Glucose: 99 mg/dL (ref 65–99)
Potassium: 3.7 mmol/L (ref 3.5–5.1)
Sodium: 142 mmol/L (ref 136–145)

## 2012-03-08 LAB — HEPATIC FUNCTION PANEL A (ARMC): SGOT(AST): 25 U/L (ref 15–37)

## 2012-03-08 LAB — TROPONIN I: Troponin-I: <0.02 ng/mL

## 2014-01-10 ENCOUNTER — Emergency Department: Payer: Self-pay | Admitting: Emergency Medicine

## 2015-03-21 ENCOUNTER — Emergency Department
Admission: EM | Admit: 2015-03-21 | Discharge: 2015-03-22 | Discharge status: Against Medical Advice | Payer: Self-pay | Attending: Emergency Medicine | Admitting: Emergency Medicine

## 2015-03-21 ENCOUNTER — Encounter: Payer: Self-pay | Admitting: *Deleted

## 2015-03-21 ENCOUNTER — Emergency Department: Payer: Self-pay

## 2015-03-21 DIAGNOSIS — Z720 Tobacco use: Secondary | ICD-10-CM | POA: Insufficient documentation

## 2015-03-21 DIAGNOSIS — R0602 Shortness of breath: Secondary | ICD-10-CM | POA: Insufficient documentation

## 2015-03-21 DIAGNOSIS — R05 Cough: Secondary | ICD-10-CM | POA: Insufficient documentation

## 2015-03-21 DIAGNOSIS — R509 Fever, unspecified: Secondary | ICD-10-CM | POA: Insufficient documentation

## 2015-03-21 HISTORY — DX: Personal history of other diseases of the musculoskeletal system and connective tissue: Z87.39

## 2015-03-21 NOTE — ED Notes (Signed)
Pt reports cough and some shortness of breath x 1 week. States started out as sinus infection. Productive cough, low grade fever. Here with mother who has similar symptoms.

## 2016-01-04 ENCOUNTER — Emergency Department: Payer: Self-pay

## 2016-01-04 ENCOUNTER — Encounter: Payer: Self-pay | Admitting: Emergency Medicine

## 2016-01-04 ENCOUNTER — Emergency Department
Admission: EM | Admit: 2016-01-04 | Discharge: 2016-01-06 | Disposition: A | Discharge status: Home / Self Care | Payer: Self-pay | Attending: Emergency Medicine | Admitting: Emergency Medicine

## 2016-01-04 DIAGNOSIS — E785 Hyperlipidemia, unspecified: Secondary | ICD-10-CM | POA: Insufficient documentation

## 2016-01-04 DIAGNOSIS — F332 Major depressive disorder, recurrent severe without psychotic features: Secondary | ICD-10-CM | POA: Diagnosis present

## 2016-01-04 DIAGNOSIS — F32A Depression, unspecified: Secondary | ICD-10-CM

## 2016-01-04 DIAGNOSIS — R51 Headache: Secondary | ICD-10-CM | POA: Insufficient documentation

## 2016-01-04 DIAGNOSIS — F17200 Nicotine dependence, unspecified, uncomplicated: Secondary | ICD-10-CM | POA: Diagnosis present

## 2016-01-04 DIAGNOSIS — I10 Essential (primary) hypertension: Secondary | ICD-10-CM | POA: Insufficient documentation

## 2016-01-04 DIAGNOSIS — F329 Major depressive disorder, single episode, unspecified: Secondary | ICD-10-CM | POA: Insufficient documentation

## 2016-01-04 DIAGNOSIS — Z5181 Encounter for therapeutic drug level monitoring: Secondary | ICD-10-CM | POA: Insufficient documentation

## 2016-01-04 DIAGNOSIS — R45851 Suicidal ideations: Secondary | ICD-10-CM | POA: Insufficient documentation

## 2016-01-04 HISTORY — DX: Depression, unspecified: F32.A

## 2016-01-04 HISTORY — DX: Major depressive disorder, single episode, unspecified: F32.9

## 2016-01-04 LAB — CBC
HCT: 41.1 % (ref 40.0–52.0)
Hemoglobin: 14.6 g/dL (ref 13.0–18.0)
MCH: 31.1 pg (ref 26.0–34.0)
MCHC: 35.5 g/dL (ref 32.0–36.0)
MCV: 87.7 fL (ref 80.0–100.0)
PLATELETS: 226 10*3/uL (ref 150–440)
RBC: 4.68 MIL/uL (ref 4.40–5.90)
RDW: 13.8 % (ref 11.5–14.5)
WBC: 10.1 10*3/uL (ref 3.8–10.6)

## 2016-01-04 LAB — COMPREHENSIVE METABOLIC PANEL
ALBUMIN: 4.5 g/dL (ref 3.5–5.0)
ALT: 17 U/L (ref 17–63)
AST: 32 U/L (ref 15–41)
Alkaline Phosphatase: 88 U/L (ref 38–126)
Anion gap: 12 (ref 5–15)
BUN: 11 mg/dL (ref 6–20)
CHLORIDE: 99 mmol/L — AB (ref 101–111)
CO2: 20 mmol/L — ABNORMAL LOW (ref 22–32)
CREATININE: 1.38 mg/dL — AB (ref 0.61–1.24)
Calcium: 9.2 mg/dL (ref 8.9–10.3)
Glucose, Bld: 133 mg/dL — ABNORMAL HIGH (ref 65–99)
POTASSIUM: 3.6 mmol/L (ref 3.5–5.1)
SODIUM: 131 mmol/L — AB (ref 135–145)
TOTAL PROTEIN: 7.8 g/dL (ref 6.5–8.1)
Total Bilirubin: 0.4 mg/dL (ref 0.3–1.2)

## 2016-01-04 LAB — ACETAMINOPHEN LEVEL: Acetaminophen (Tylenol), Serum: <10 ug/mL — ABNORMAL LOW (ref 10–30)

## 2016-01-04 LAB — URINE DRUG SCREEN, QUALITATIVE (ARMC ONLY)
Amphetamines, Ur Screen: NOT DETECTED
BENZODIAZEPINE, UR SCRN: NOT DETECTED
Barbiturates, Ur Screen: NOT DETECTED
CANNABINOID 50 NG, UR ~~LOC~~: NOT DETECTED
Cocaine Metabolite,Ur ~~LOC~~: NOT DETECTED
MDMA (ECSTASY) UR SCREEN: NOT DETECTED
Methadone Scn, Ur: NOT DETECTED
Opiate, Ur Screen: NOT DETECTED
Phencyclidine (PCP) Ur S: NOT DETECTED
TRICYCLIC, UR SCREEN: POSITIVE — AB

## 2016-01-04 LAB — ETHANOL

## 2016-01-04 LAB — SALICYLATE LEVEL

## 2016-01-04 MED ORDER — MIRTAZAPINE 15 MG PO TABS
15.0000 mg | ORAL_TABLET | Freq: Every day | ORAL | Status: DC
  Administered 2016-01-04 – 2016-01-05 (×2): 15 mg via ORAL
  Filled 2016-01-04 (×2): qty 1

## 2016-01-04 MED ORDER — NICOTINE 14 MG/24HR TD PT24
14.0000 mg | MEDICATED_PATCH | Freq: Once | TRANSDERMAL | Status: DC
  Filled 2016-01-04 (×2): qty 1

## 2016-01-04 MED ORDER — TRAZODONE HCL 100 MG PO TABS
100.0000 mg | ORAL_TABLET | Freq: Every day | ORAL | Status: DC
  Administered 2016-01-04 – 2016-01-05 (×2): 100 mg via ORAL
  Filled 2016-01-04 (×2): qty 1

## 2016-01-04 MED ORDER — NICOTINE 21 MG/24HR TD PT24
21.0000 mg | MEDICATED_PATCH | Freq: Every day | TRANSDERMAL | Status: DC
  Administered 2016-01-05 – 2016-01-06 (×2): 21 mg via TRANSDERMAL
  Filled 2016-01-04: qty 1

## 2016-01-04 NOTE — ED Notes (Signed)
Patient states has had some thoughts of harming self, states does not have a plan and has not done anything to this point to harm self.

## 2016-01-04 NOTE — BH Assessment (Signed)
Assessment Note  Noah Avery is an 45 y.o. male. Patient presents to the ED because of suicidal thoughts with a plan.  Patient continues to endorse suicidal thoughts with plan to jump in near by river with intent to drown since he can not swim. "I don't know what I would do if I am left alone".    Patient denies past history of suicidal gestures although previously sent to ED for an alleged overdose but it was a medication reaction.  He reports current symptoms are contributed by family conflicts, being the primary care giver for mother, and financial.  Patient denies homicidal ideations, A/VH, and other self-injurious behaviors.  Patient denies current substance abuse.      Diagnosis: Adjustment Disorder, depressed  Past Medical History:  Past Medical History  Diagnosis Date  . H/O degenerative disc disease   . Depression     Past Surgical History  Procedure Laterality Date  . Back surgery      Family History: History reviewed. No pertinent family history.  Social History:  reports that he has been smoking.  He does not have any smokeless tobacco history on file. He reports that he does not drink alcohol or use illicit drugs.  Additional Social History:  Alcohol / Drug Use Pain Medications: see chart  Prescriptions: see chart Over the Counter: see chart History of alcohol / drug use?:  (pt denies abuse)  CIWA: CIWA-Ar BP: (!) 143/96 mmHg Pulse Rate: (!) 129 COWS:    Allergies:  Allergies  Allergen Reactions  . Silver Nitrate     Home Medications:  (Not in a hospital admission)  OB/GYN Status:  No LMP for male patient.  General Assessment Data Location of Assessment: [facility] ED TTS Assessment: In system Is this a Tele or Face-to-Face Assessment?: Face-to-Face Is this an Initial Assessment or a Re-assessment for this encounter?: Initial Assessment Marital status: Divorced Is patient pregnant?: No Pregnancy Status: No Living Arrangements: Parent Can pt return to  current living arrangement?: Yes Admission Status: Voluntary Is patient capable of signing voluntary admission?: Yes Referral Source: Self/Family/Friend Insurance type: na  Medical Screening Exam [facility] Walk-in ONLY) Medical Exam completed: Yes  Crisis Care Plan Living Arrangements: Parent Name of Psychiatrist: none Name of Therapist: none  Education Status Is patient currently in school?: No  Risk to self with the past 6 months Suicidal Ideation: Yes-Currently Present Has patient been a risk to self within the past 6 months prior to admission? : No Suicidal Intent: Yes-Currently Present Has patient had any suicidal intent within the past 6 months prior to admission? : No Is patient at risk for suicide?: Yes Suicidal Plan?: Yes-Currently Present Has patient had any suicidal plan within the past 6 months prior to admission? : No Specify Current Suicidal Plan: Jump in river b/o not being able to swim Access to Means: Yes Specify Access to Suicidal Means: specific river What has been your use of drugs/alcohol within the last 12 months?: Pt denies Previous Attempts/Gestures: No How many times?: 0 Other Self Harm Risks: na Intentional Self Injurious Behavior: None Family Suicide History: Unknown Recent stressful life event(s): Conflict (Comment), Loss (Comment), Financial Problems Persecutory voices/beliefs?: No Depression: Yes Depression Symptoms: Guilt, Feeling angry/irritable, Feeling worthless/self pity, Loss of interest in usual pleasures Substance abuse history and/or treatment for substance abuse?: No  Risk to Others within the past 6 months Homicidal Ideation: No-Not Currently/Within Last 6 Months Does patient have any lifetime risk of violence toward others beyond the six months  prior to admission? : No Thoughts of Harm to Others: No-Not Currently Present/Within Last 6 Months Current Homicidal Intent: No-Not Currently/Within Last 6 Months Current Homicidal Plan: No-Not  Currently/Within Last 6 Months Access to Homicidal Means: No History of harm to others?: No Assessment of Violence: None Noted Does patient have access to weapons?: No Criminal Charges Pending?: No Does patient have a court date: No Is patient on probation?: No  Psychosis Hallucinations: None noted Delusions: None noted  Mental Status Report Appearance/Hygiene: In scrubs Eye Contact: Good Motor Activity: Freedom of movement Speech: Logical/coherent Level of Consciousness: Alert Mood: Anxious Affect: Anxious Anxiety Level: Minimal Thought Processes: Relevant Judgement: Partial Orientation: Person, Place, Time, Situation, Appropriate for developmental age Obsessive Compulsive Thoughts/Behaviors: None  Cognitive Functioning Concentration: Fair Memory: Recent Intact, Remote Intact IQ: Average Insight: Fair Impulse Control: Fair Appetite: Fair Weight Loss: 0 Weight Gain: 0 Sleep: Decreased Total Hours of Sleep: 4 Vegetative Symptoms: None  ADLScreening [facility]) Patient's cognitive ability adequate to safely complete daily activities?: Yes Patient able to express need for assistance with ADLs?: Yes Independently performs ADLs?: Yes (appropriate for developmental age)  Prior Inpatient Therapy Prior Inpatient Therapy: Yes Prior Therapy Dates: 2008 Prior Therapy Facilty/Provider(s): unknown Reason for Treatment: potential overdose  Prior Outpatient Therapy Prior Outpatient Therapy: No Does patient have an ACCT team?: No Does patient have Intensive In-House Services?  : No Does patient have [facility] services? : No Does patient have P4CC services?: No  ADL Screening (condition at time of admission) Patient's cognitive ability adequate to safely complete daily activities?: Yes Patient able to express need for assistance with ADLs?: Yes Independently performs ADLs?: Yes (appropriate for developmental age)       Abuse/Neglect Assessment (Assessment  to be complete while patient is alone) Physical Abuse: Denies Verbal Abuse: Denies Sexual Abuse: Denies Exploitation of patient/patient's resources: Denies Self-Neglect: Denies Values / Beliefs Cultural Requests During Hospitalization: None Spiritual Requests During Hospitalization: None Consults Spiritual Care Consult Needed: No Social Work Consult Needed: No      Additional Information 1:1 In Past 12 Months?: No CIRT Risk: No Elopement Risk: No Does patient have medical clearance?: Yes     Disposition:  Disposition Initial Assessment Completed for this Encounter: Yes Disposition of Patient: Other dispositions (pending) Other disposition(s): Other (Comment) (pending)  On Site Evaluation by:   Reviewed with Physician:    Maryelizabeth Rowanorbett, Brilyn Tuller A 01/04/2016 3:13 PM

## 2016-01-04 NOTE — ED Provider Notes (Signed)
[facility] Emergency Department Provider Note   ____________________________________________  Time seen: Approximately 11:11 AM  I have reviewed the triage vital signs and the nursing notes.   HISTORY  Chief Complaint Suicidal    HPI SHLOK RAZ is a 45 y.o. male patient reports history of depression in the past. He was on medicines years ago with stopped. He is now depressed again thinking he might hurt himself. He also complains of low back pain she has had back surgery 4 for bulging disc. Patient also is now complaining of some right sided paraspinous muscle pain in the neck. He also reports that he has had intermittent left sided arm and leg numbness last month or so. Does not seem to be getting his bad now as it was in the past. He has no weakness or incoordination. She is worried about his neck. He is worried he may be getting an degenerative disc disease in his neck as well as his low back. He ports the pain is usually controlled with Aleve or Tylenol.   Past Medical History  Diagnosis Date  . H/O degenerative disc disease   . Depression     Patient Active Problem List   Diagnosis Date Noted  . Major depressive disorder, recurrent severe without psychotic features (HCC) 01/04/2016  . Tobacco use disorder 01/04/2016  . CONSTIPATION 04/22/2010  . ABDOMINAL PAIN 04/22/2010  . LUMBAR RADICULOPATHY 08/05/2009  . HYPERLIPIDEMIA 10/25/2008  . DEPRESSION 10/25/2008  . MIGRAINE, COMMON 10/25/2008  . HYPERTENSION 10/25/2008  . HEMORRHOIDS 10/25/2008  . DENTAL CARIES 10/25/2008  . BLOOD IN STOOL 10/25/2008  . BACK PAIN 10/25/2008  . CARDIAC MURMUR 10/25/2008    Past Surgical History  Procedure Laterality Date  . Back surgery      No current outpatient prescriptions on file.  Allergies Silver nitrate  History reviewed. No pertinent family history.  Social History Social History  Substance Use Topics  . Smoking status: Current Every  Day Smoker  . Smokeless tobacco: None  . Alcohol Use: No    Review of Systems Constitutional: No fever/chills Eyes: No visual changes. ENT: No sore throat. Cardiovascular: Denies chest pain. Respiratory: Denies shortness of breath. Gastrointestinal: No abdominal pain.  No nausea, no vomiting.  No diarrhea.  No constipation. Genitourinary: Negative for dysuria. Musculoskeletal: See history of present illness. Skin: Negative for rash. Neurological: Occasional headaches headaches, see history of present illness  10-point ROS otherwise negative.  ____________________________________________   PHYSICAL EXAM:  VITAL SIGNS: ED Triage Vitals  Enc Vitals Group     BP 01/04/16 0940 143/96 mmHg     Pulse Rate 01/04/16 0940 129     Resp 01/04/16 0940 18     Temp 01/04/16 0940 98.5 F (36.9 C)     Temp Source 01/04/16 0940 Oral     SpO2 01/04/16 0940 97 %     Weight 01/04/16 0940 170 lb (77.111 kg)     Height 01/04/16 0940  (1.803 m)     Head Cir --      Peak Flow --      Pain Score 01/04/16 0938 0     Pain Loc --      Pain Edu? --      Excl. in GC? --     Constitutional: Alert and oriented. Well appearing and in no acute distress. Eyes: Conjunctivae are normal. PERRL. EOMI. Head: Atraumatic. Nose: No congestion/rhinnorhea. Mouth/Throat: Mucous membranes are moist.  Oropharynx non-erythematous. Neck: No stridor.  Cardiovascular: Normal rate, regular rhythm. Grossly normal heart sounds.  Good peripheral circulation. Respiratory: Normal respiratory effort.  No retractions. Lungs CTAB. Gastrointestinal: Soft and nontender. No distention. No abdominal bruits. No CVA tenderness. Musculoskeletal: No lower extremity tenderness nor edema.  No joint effusions. Neurologic:  Normal speech and language. No gross focal neurologic deficits are appreciated. No gait instability. Skin:  Skin is warm, dry and intact. No rash noted.   ____________________________________________     LABS (all labs ordered are listed, but only abnormal results are displayed)  Labs Reviewed  COMPREHENSIVE METABOLIC PANEL - Abnormal; Notable for the following:    Sodium 131 (*)    Chloride 99 (*)    CO2 20 (*)    Glucose, Bld 133 (*)    Creatinine, Ser 1.38 (*)    All other components within normal limits  ACETAMINOPHEN LEVEL - Abnormal; Notable for the following:    Acetaminophen (Tylenol), Serum <10 (*)    All other components within normal limits  URINE DRUG SCREEN, QUALITATIVE ([facility] ONLY) - Abnormal; Notable for the following:    Tricyclic, Ur Screen POSITIVE (*)    All other components within normal limits  ETHANOL  SALICYLATE LEVEL  CBC   ____________________________________________  EKG   ____________________________________________  RADIOLOGY  ____________________________________________   PROCEDURES    Procedures    ____________________________________________   INITIAL IMPRESSION / ASSESSMENT AND PLAN / ED COURSE  Pertinent labs & imaging results that were available during my care of the patient were reviewed by me and considered in my medical decision making (see chart for details).   ____________________________________________   FINAL CLINICAL IMPRESSION(S) / ED DIAGNOSES  Final diagnoses:  Depressed      NEW MEDICATIONS STARTED DURING THIS VISIT:  New Prescriptions   No medications on file     Note:  This document was prepared using Dragon voice recognition software and may include unintentional dictation errors.    Arnaldo NatalPaul F Malinda, MD 01/04/16 1739

## 2016-01-04 NOTE — ED Notes (Signed)
Pt to ed with c/o depression x 12 hours.  Pt states he is having personal stress in his life and feels depressed from that.  Pt reports he is supposed to be on depression meds but has not been on meds x 8 years.  Denies HI.

## 2016-01-04 NOTE — ED Notes (Signed)

## 2016-01-04 NOTE — Progress Notes (Signed)
Disposition:    This Clinical research associatewriter consulted with Dr. Jennet MaduroPucilowska it is recommended to refer for inpatient hospitalization.       Maryelizabeth Rowanressa Ayani Ospina, MSW, Clare CharonLCSW, LCAS [facility] Triage Specialist [phone]

## 2016-01-04 NOTE — Consult Note (Signed)
[facility] Consult   Reason for Consult:  Suicidal ideation Referring Physician:  Dr. Cinda Quest Patient Identification: Noah Avery MRN:  [id number] Principal Diagnosis: Major depressive disorder, recurrent severe without psychotic features [facility]) Diagnosis:   Patient Active Problem List   Diagnosis Date Noted  . Major depressive disorder, recurrent severe without psychotic features ([place]) [F33.2] 01/04/2016  . Tobacco use disorder [F17.200] 01/04/2016  . CONSTIPATION [K59.00] 04/22/2010  . ABDOMINAL PAIN [R10.9] 04/22/2010  . LUMBAR RADICULOPATHY [IMO0002] 08/05/2009  . HYPERLIPIDEMIA [E78.5] 10/25/2008  . DEPRESSION [F32.9] 10/25/2008  . MIGRAINE, COMMON [G43.009] 10/25/2008  . HYPERTENSION [I10] 10/25/2008  . HEMORRHOIDS [K64.9] 10/25/2008  . DENTAL CARIES [K02.9] 10/25/2008  . BLOOD IN STOOL [K92.1] 10/25/2008  . BACK PAIN [M54.9] 10/25/2008  . CARDIAC MURMUR [R01.1] 10/25/2008    Total Time spent with patient: 1 hour  Subjective:    Identifying data. Noah Avery is a 45 y.o. male with a history of depression.  Chief complaint. "I came here because something awful might happen."  History of present illness. Information was obtained from the patient and the chart. The patient has a history of depression for which he has not been treated in years. He had been doing fine until recently when multiple stressors making her feel increasingly depressed with poor sleep and appetite, anhedonia, feeling of guilt and hopelessness worthlessness, poor energy and concentration, crying spells, social isolation, irritability and suicidal thinking. He passing thoughts of suicide for the past 2 weeks but last night he became acutely suicidal and walked to the hospital help. He reports multiple stressors related mostly to his family. 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 She has pulmonary fibrosis but is not interested in lung transplant which the patient desires for her. He  has also had strained relationship with his children. His daughter was upset with him over the holidays and blocking of defriended him. He spends most of our interview explaining his dysfunctional family. Suicidal thinking is quite mean to him and making rather distress. He denies psychotic symptoms or symptoms suggestive of bipolar mania. He denies heightened anxiety. He denies alcohol or illicit substance use.   Past psychiatric history. There were 2 attempts to treat his depression. The first one ended poorly as his primary care physician reportedly prescribed two medications that were incompatible with each other. But the patient ended up in the hospital in what appears to be suicide attempt by overdose. He stated vague about it. He then was connected to a psychiatrist who started prescribing benzodiazepines. At the last visit with this doctor with the patient was about to lose her insurance and good doctor gave him 3 months supply benzos which the patient used in three weeks. He felt "great" but withdrawals were terrible.   Family psychiatric history. Multiple family members with depression and anxiety.  Social history. He's been married twice. He has grown children. He has not been employed for a while taking care of his ailing mother.  Risk to Self: Suicidal Ideation: Yes-Currently Present Suicidal Intent: Yes-Currently Present Is patient at risk for suicide?: Yes Suicidal Plan?: Yes-Currently Present Specify Current Suicidal Plan: Jump in river b/o not being able to swim Access to Means: Yes Specify Access to Suicidal Means: specific river What has been your use of drugs/alcohol within the last 12 months?: Pt denies How many times?: 0 Other Self Harm Risks: na Intentional Self Injurious Behavior: None Risk to Others: Homicidal Ideation: No-Not Currently/Within Last 6 Months Thoughts of Harm to  Others: No-Not Currently Present/Within Last 6 Months Current Homicidal Intent: No-Not  Currently/Within Last 6 Months Current Homicidal Plan: No-Not Currently/Within Last 6 Months Access to Homicidal Means: No History of harm to others?: No Assessment of Violence: None Noted Does patient have access to weapons?: No Criminal Charges Pending?: No Does patient have a court date: No Prior Inpatient Therapy: Prior Inpatient Therapy: Yes Prior Therapy Dates: 2008 Prior Therapy Facilty/Provider(s): unknown Reason for Treatment: potential overdose Prior Outpatient Therapy: Prior Outpatient Therapy: No Does patient have an ACCT team?: No Does patient have Intensive In-House Services?  : No Does patient have [facility] services? : No Does patient have P4CC services?: No  Past Medical History:  Past Medical History  Diagnosis Date  . H/O degenerative disc disease   . Depression     Past Surgical History  Procedure Laterality Date  . Back surgery     Family History: History reviewed. No pertinent family history.  Social History:  History  Alcohol Use No     History  Drug Use No    Social History   Social History  . Marital Status: Married    Spouse Name: N/A  . Number of Children: N/A  . Years of Education: N/A   Social History Main Topics  . Smoking status: Current Every Day Smoker  . Smokeless tobacco: None  . Alcohol Use: No  . Drug Use: No  . Sexual Activity: Not Asked   Other Topics Concern  . None   Social History Narrative   Additional Social History:    Allergies:   Allergies  Allergen Reactions  . Silver Nitrate     Labs:  Results for orders placed or performed during the hospital encounter of 01/04/16 (from the past 48 hour(s))  Comprehensive metabolic panel     Status: Abnormal   Collection Time: 01/04/16  9:42 AM  Result Value Ref Range   Sodium 131 (L) 135 - 145 mmol/L   Potassium 3.6 3.5 - 5.1 mmol/L   Chloride 99 (L) 101 - 111 mmol/L   CO2 20 (L) 22 - 32 mmol/L   Glucose, Bld 133 (H) 65 - 99 mg/dL   BUN 11 6 - 20 mg/dL    Creatinine, Ser 1.38 (H) 0.61 - 1.24 mg/dL   Calcium 9.2 8.9 - 10.3 mg/dL   Total Protein 7.8 6.5 - 8.1 g/dL   Albumin 4.5 3.5 - 5.0 g/dL   AST 32 15 - 41 U/L   ALT 17 17 - 63 U/L   Alkaline Phosphatase 88 38 - 126 U/L   Total Bilirubin 0.4 0.3 - 1.2 mg/dL   GFR calc non Af Amer >60 >60 mL/min   GFR calc Af Amer >60 >60 mL/min    Comment: (NOTE) The eGFR has been calculated using the CKD EPI equation. This calculation has not been validated in all clinical situations. eGFR's persistently <60 mL/min signify possible Chronic Kidney Disease.    Anion gap 12 5 - 15  Ethanol     Status: None   Collection Time: 01/04/16  9:42 AM  Result Value Ref Range   Alcohol, Ethyl (B) <5 <5 mg/dL    Comment:        LOWEST DETECTABLE LIMIT FOR SERUM ALCOHOL IS 5 mg/dL FOR MEDICAL PURPOSES ONLY   Salicylate level     Status: None   Collection Time: 01/04/16  9:42 AM  Result Value Ref Range   Salicylate Lvl <5.2 2.8 - 30.0 mg/dL  Acetaminophen level  Status: Abnormal   Collection Time: 01/04/16  9:42 AM  Result Value Ref Range   Acetaminophen (Tylenol), Serum <10 (L) 10 - 30 ug/mL    Comment:        THERAPEUTIC CONCENTRATIONS VARY SIGNIFICANTLY. A RANGE OF 10-30 ug/mL MAY BE AN EFFECTIVE CONCENTRATION FOR MANY PATIENTS. HOWEVER, SOME ARE BEST TREATED AT CONCENTRATIONS OUTSIDE THIS RANGE. ACETAMINOPHEN CONCENTRATIONS >150 ug/mL AT 4 HOURS AFTER INGESTION AND >50 ug/mL AT 12 HOURS AFTER INGESTION ARE OFTEN ASSOCIATED WITH TOXIC REACTIONS.   cbc     Status: None   Collection Time: 01/04/16  9:42 AM  Result Value Ref Range   WBC 10.1 3.8 - 10.6 K/uL   RBC 4.68 4.40 - 5.90 MIL/uL   Hemoglobin 14.6 13.0 - 18.0 g/dL   HCT 41.1 40.0 - 52.0 %   MCV 87.7 80.0 - 100.0 fL   MCH 31.1 26.0 - 34.0 pg   MCHC 35.5 32.0 - 36.0 g/dL   RDW 13.8 11.5 - 14.5 %   Platelets 226 150 - 440 K/uL  Urine Drug Screen, Qualitative     Status: Abnormal   Collection Time: 01/04/16  9:42 AM  Result Value  Ref Range   Tricyclic, Ur Screen POSITIVE (A) NONE DETECTED   Amphetamines, Ur Screen NONE DETECTED NONE DETECTED   MDMA (Ecstasy)Ur Screen NONE DETECTED NONE DETECTED   Cocaine Metabolite,Ur [place] NONE DETECTED NONE DETECTED   Opiate, Ur Screen NONE DETECTED NONE DETECTED   Phencyclidine (PCP) Ur S NONE DETECTED NONE DETECTED   Cannabinoid 50 Ng, Ur [place] NONE DETECTED NONE DETECTED   Barbiturates, Ur Screen NONE DETECTED NONE DETECTED   Benzodiazepine, Ur Scrn NONE DETECTED NONE DETECTED   Methadone Scn, Ur NONE DETECTED NONE DETECTED    Comment: (NOTE) 413  Tricyclics, urine               Cutoff 1000 ng/mL 200  Amphetamines, urine             Cutoff 1000 ng/mL 300  MDMA (Ecstasy), urine           Cutoff 500 ng/mL 400  Cocaine Metabolite, urine       Cutoff 300 ng/mL 500  Opiate, urine                   Cutoff 300 ng/mL 600  Phencyclidine (PCP), urine      Cutoff 25 ng/mL 700  Cannabinoid, urine              Cutoff 50 ng/mL 800  Barbiturates, urine             Cutoff 200 ng/mL 900  Benzodiazepine, urine           Cutoff 200 ng/mL 1000 Methadone, urine                Cutoff 300 ng/mL 1100 1200 The urine drug screen provides only a preliminary, unconfirmed 1300 analytical test result and should not be used for non-medical 1400 purposes. Clinical consideration and professional judgment should 1500 be applied to any positive drug screen result due to possible 1600 interfering substances. A more specific alternate chemical method 1700 must be used in order to obtain a confirmed analytical result.  1800 Gas chromato graphy / mass spectrometry (GC/MS) is the preferred 1900 confirmatory method.     Current Facility-Administered Medications  Medication Dose Route Frequency Provider Last Rate Last Dose  . mirtazapine (REMERON) tablet 15 mg  15 mg Oral QHS  B  , MD      . nicotine (NICODERM CQ - dosed in mg/24 hours) patch 21 mg  21 mg Transdermal Daily  B , MD       . traZODone (DESYREL) tablet 100 mg  100 mg Oral QHS  B , MD       No current outpatient prescriptions on file.    Musculoskeletal: Strength & Muscle Tone: within normal limits Gait & Station: normal Patient leans: N/A  Psychiatric Specialty Exam: Physical Exam  Nursing note and vitals reviewed.   Review of Systems  Psychiatric/Behavioral: Positive for depression and suicidal ideas.  All other systems reviewed and are negative.   Blood pressure 140/100, pulse 97, temperature 98.2 F (36.8 C), temperature source Oral, resp. rate 20, height 5' 11" (1.803 m), weight 77.111 kg (170 lb), SpO2 98 %.Body mass index is 23.72 kg/(m^2).  General Appearance: Casual  Eye Contact:  Good  Speech:  Clear and Coherent  Volume:  Normal  Mood:  Depressed  Affect:  Flat  Thought Process:  Goal Directed  Orientation:  Full (Time, Place, and Person)  Thought Content:  WDL  Suicidal Thoughts:  Yes.  with intent/plan  Homicidal Thoughts:  No  Memory:  Immediate;   Fair Recent;   Fair Remote;   Fair  Judgement:  Impaired  Insight:  Shallow  Psychomotor Activity:  Normal  Concentration:  Concentration: Poor and Attention Span: Poor  Recall:  [employer] of Knowledge:  Fair  Language:  Fair  Akathisia:  No  Handed:  Right  AIMS (if indicated):     Assets:  Communication Skills Desire for Improvement Housing Physical Health Resilience Social Support  ADL's:  Intact  Cognition:  WNL  Sleep:        Treatment Plan Summary: Daily contact with patient to assess and evaluate symptoms and progress in treatment and Medication management   Mr. Drumwright is a 45 year old male with his of depression admitted for suicidal ideation in the context of severe social stressors.  1. Suicidal ideation. The patient is able to contract for safety in the hospital.  2. Mood. We will start Remeron tonight.  3. Insomnia. Trazodone is available.  4. Smoking. Nicotine patch is  available.  5. Patient will be transferred to psychiatry as soon as that is available.  Disposition: Recommend psychiatric Inpatient admission when medically cleared. Supportive therapy provided about ongoing stressors.  Orson Slick, MD 01/04/2016 6:07 PM

## 2016-01-04 NOTE — ED Notes (Signed)
BEHAVIORAL HEALTH ROUNDING Patient sleeping: No. Patient alert and oriented: yes Behavior appropriate: Yes.  ; If no, describe:  Nutrition and fluids offered: Yes  Toileting and hygiene offered: Yes  Sitter present: not applicable Law enforcement present: Yes  

## 2016-01-04 NOTE — Consult Note (Signed)
  Mr. Noah Avery has a h/o depression. He is acutely suicidal.  PLAN: 1. The patient meets criteria for inpatient hospitalization.  2. No beds available.  3. I will start remeron tonight.  4. Full note to follow.

## 2016-01-04 NOTE — Progress Notes (Signed)
Attempted to secure placement with the following facilities:  [facility]  [facility]  Maryelizabeth Rowanressa Khadejah Son, MSW, Clare CharonLCSW, LCAS, CCS-I [facility] Triage Specialist [phone]

## 2016-01-04 NOTE — ED Notes (Signed)
Pt brought from main er pt ivc c/o depression and vague si ,pt is very pleasant and contracts for safety

## 2016-01-04 NOTE — ED Notes (Signed)

## 2016-01-04 NOTE — ED Notes (Addendum)
Psyche MD at bedside.

## 2016-01-04 NOTE — Progress Notes (Signed)
This Clinical research associatewriter completed a chart review. This writer attempted to complete assessment but he was on the way to Xray.      Noah Avery, MSW, Clare CharonLCSW, LCAS [facility] T[facility] [phone]

## 2016-01-05 ENCOUNTER — Other Ambulatory Visit: Payer: Self-pay

## 2016-01-05 LAB — BASIC METABOLIC PANEL
ANION GAP: 5 (ref 5–15)
BUN: 13 mg/dL (ref 6–20)
CHLORIDE: 106 mmol/L (ref 101–111)
CO2: 26 mmol/L (ref 22–32)
Calcium: 8.8 mg/dL — ABNORMAL LOW (ref 8.9–10.3)
Creatinine, Ser: 1.16 mg/dL (ref 0.61–1.24)
GFR calc Af Amer: >60 mL/min (ref >60)
GLUCOSE: 113 mg/dL — AB (ref 65–99)
POTASSIUM: 4 mmol/L (ref 3.5–5.1)
Sodium: 137 mmol/L (ref 135–145)

## 2016-01-05 NOTE — ED Provider Notes (Signed)
-----------------------------------------   7:34 AM on 01/05/2016 -----------------------------------------   Blood pressure 135/94, pulse 110, temperature 98.8 F (37.1 C), temperature source Oral, resp. rate 18, height 5\' 11"  (1.803 m), weight 170 lb (77.111 kg), SpO2 99 %.  The patient had no acute events since last update.  Calm and cooperative at this time.   The patient was recommended for inpatient treatment. At this time he is awaiting acceptance to another facility.     Rebecka ApleyAllison P Webster, MD 01/05/16 [phone]

## 2016-01-05 NOTE — ED Notes (Signed)
Pt continues to sleep he is in no distress. Breakfast remains in room he has not eaten

## 2016-01-05 NOTE — Progress Notes (Signed)
[facility] Referral packet faxed to [phone] @ 1220.    01/05/2016 Cheryl FlashNicole Antoinetta Berrones, MS, NCC, LPCA Therapeutic Triage Specialist

## 2016-01-05 NOTE — Consult Note (Signed)
  There are no new complaints. Tolerates medications well. We will admit to psychiatry tonight. 

## 2016-01-05 NOTE — Progress Notes (Signed)
[facility] to possibly [facility] pt. Pt not acceptable at this time due to elevated heart rate and BP. Vitals have not been updated as of yet. Pt nurse notified.   01/05/2016 Cheryl FlashNicole Aireana Ryland, MS, NCC, LPCA Therapeutic Triage Specialist

## 2016-01-05 NOTE — ED Notes (Signed)
Pt states that whatever sleep meds they gave him worked very well and he hasnt slept that long in  A long time . He states that he has had sporadic issues with his high heart rate and bp he has had halter monitors etc but no specific problem was ever found and he is on no meds. He states now he is anxious from nicotine withdrawal he is a 2 pack a day smoker. He has no somatic c/o except  He vague appetite issues where he only eats once a day he has no other c/o except  his depression,will discuss pulse with ed Dr  .

## 2016-01-05 NOTE — ED Notes (Signed)
Patient is calm and cooperative in no apparent distress. Patient denies SI, HI. Patient is encouraged to let nursing staff know of any concerns or needs.Will continue 15 minute checks and observation by security camera for safety.

## 2016-01-05 NOTE — ED Notes (Signed)
Snack tray provided at this time. 

## 2016-01-06 MED ORDER — MIRTAZAPINE 15 MG PO TABS: 15.0000 mg | ORAL_TABLET | Freq: Every day | ORAL | Status: DC

## 2016-01-06 NOTE — ED Notes (Signed)
Patient asleep in room. No noted distress or abnormal behavior. Will continue 15 minute checks and observation by security cameras for safety.  Meal tray left at bedside. 

## 2016-01-06 NOTE — ED Notes (Signed)
Patient resting quietly in no apparent distress.No issues to report this shift. Will continue 15 minute checks and observation by security camera for safety.

## 2016-01-06 NOTE — ED Notes (Signed)
Patient discharged ambulatory to self. He denies SI or HI. Discharge instructions reviewed with patient, he verbalizes understanding. Patient received copy of DC plan and all personal belongings.

## 2016-01-06 NOTE — Consult Note (Addendum)
  Mr. Labrie has a h/o depression. He came to the ER complaining of suicidal ideation in the context of family conflict. He was started on Remeron and Trazodone.   Today, he denies suicidal or homicidal ideation and is able to contract for safety. He is forward thinking and more optimistic about the future.  PLAN:  1. The patient no longer meets criteria for IVC. I will terminate proceedings. Please discharge as appropriate.  2. The patient agrees to continue on Remeron. Rx given.  3. He will follow up with [facility] for medication management and  therapy. He met with Sherrian Divers, [facility] representative to schedule appointment.  4. Case discussed with Dr. Darl Householder.

## 2016-01-06 NOTE — ED Notes (Signed)

## 2016-01-06 NOTE — ED Notes (Signed)
Patient awake, alert, and oriented. He denies depressed mood. Denies SI. Patient states he just needed to get away from family "stressors" for a while; he now feels safe to go home.

## 2016-01-06 NOTE — ED Provider Notes (Signed)
  Physical Exam  BP 139/88 mmHg  Pulse 110  Temp(Src) 98.8 F (37.1 C) (Oral)  Resp 18  Ht 5\' 11"  (1.803 m)  Wt 170 lb (77.111 kg)  BMI 23.72 kg/m2  SpO2 100%  Physical Exam  ED Course  Procedures  MDM Patient seen by psych this AM. IVC reversed by psych. Recommend discharge. Will dc home.   Richardean Canalavid H Jaymee Tilson, MD 01/06/16 1010

## 2016-01-06 NOTE — ED Provider Notes (Signed)
-----------------------------------------   6:47 AM on 01/06/2016 -----------------------------------------   Blood pressure 139/88, pulse 110, temperature 98.8 F (37.1 C), temperature source Oral, resp. rate 18, height 5\' 11"  (1.803 m), weight 170 lb (77.111 kg), SpO2 100 %.  The patient had no acute events since last update.  Calm and cooperative at this time.  Disposition is pending per Psychiatry/Behavioral Medicine team recommendations.     Irean HongJade J Sung, MD 01/06/16 [phone]

## 2016-01-06 NOTE — Discharge Instructions (Signed)
See your doctor outpatient   Return to ER if you have thoughts of harming yourself or others, hallucinations.

## 2016-01-10 ENCOUNTER — Encounter: Payer: Self-pay | Admitting: Emergency Medicine

## 2016-01-10 DIAGNOSIS — Z79899 Other long term (current) drug therapy: Secondary | ICD-10-CM | POA: Insufficient documentation

## 2016-01-10 DIAGNOSIS — J029 Acute pharyngitis, unspecified: Secondary | ICD-10-CM | POA: Insufficient documentation

## 2016-01-10 DIAGNOSIS — F17200 Nicotine dependence, unspecified, uncomplicated: Secondary | ICD-10-CM | POA: Insufficient documentation

## 2016-01-10 DIAGNOSIS — I10 Essential (primary) hypertension: Secondary | ICD-10-CM | POA: Insufficient documentation

## 2016-01-10 DIAGNOSIS — F332 Major depressive disorder, recurrent severe without psychotic features: Secondary | ICD-10-CM | POA: Insufficient documentation

## 2016-01-10 DIAGNOSIS — E785 Hyperlipidemia, unspecified: Secondary | ICD-10-CM | POA: Insufficient documentation

## 2016-01-10 LAB — POCT RAPID STREP A: STREPTOCOCCUS, GROUP A SCREEN (DIRECT): NEGATIVE

## 2016-01-10 NOTE — ED Notes (Signed)
Patient with complaint of pain to his throat when swallowing.

## 2016-01-11 ENCOUNTER — Emergency Department
Admission: EM | Admit: 2016-01-11 | Discharge: 2016-01-11 | Disposition: A | Discharge status: Home / Self Care | Payer: Self-pay | Attending: Emergency Medicine | Admitting: Emergency Medicine

## 2016-01-11 DIAGNOSIS — J028 Acute pharyngitis due to other specified organisms: Secondary | ICD-10-CM

## 2016-01-11 DIAGNOSIS — B9789 Other viral agents as the cause of diseases classified elsewhere: Secondary | ICD-10-CM

## 2016-01-11 NOTE — ED Notes (Signed)
Pt. States sore throat started last night.  Pt. States like his glands are swollen.  Pt. States "I have 5 kids and exposed to possible strep".  Pt. States sore when he swallows.

## 2016-01-11 NOTE — Discharge Instructions (Signed)
Sore Throat A sore throat is pain, burning, irritation, or scratchiness of the throat. There is often pain or tenderness when swallowing or talking. A sore throat may be accompanied by other symptoms, such as coughing, sneezing, fever, and swollen neck glands. A sore throat is often the first sign of another sickness, such as a cold, flu, strep throat, or mononucleosis (commonly known as mono). Most sore throats go away without medical treatment. CAUSES  The most common causes of a sore throat include:  A viral infection, such as a cold, flu, or mono.  A bacterial infection, such as strep throat, tonsillitis, or whooping cough.  Seasonal allergies.  Dryness in the air.  Irritants, such as smoke or pollution.  Gastroesophageal reflux disease (GERD). HOME CARE INSTRUCTIONS   Only take over-the-counter medicines as directed by your caregiver.  Drink enough fluids to keep your urine clear or pale yellow.  Rest as needed.  Try using throat sprays, lozenges, or sucking on hard candy to ease any pain (if older than 4 years or as directed).  Sip warm liquids, such as broth, herbal tea, or warm water with honey to relieve pain temporarily. You may also eat or drink cold or frozen liquids such as frozen ice pops.  Gargle with salt water (mix 1 tsp salt with 8 oz of water).  Do not smoke and avoid secondhand smoke.  Put a cool-mist humidifier in your bedroom at night to moisten the air. You can also turn on a hot shower and sit in the bathroom with the door closed for 5-10 minutes. SEEK IMMEDIATE MEDICAL CARE IF:  You have difficulty breathing.  You are unable to swallow fluids, soft foods, or your saliva.  You have increased swelling in the throat.  Your sore throat does not get better in 7 days.  You have nausea and vomiting.  You have a fever or persistent symptoms for more than 2-3 days.  You have a fever and your symptoms suddenly get worse. MAKE SURE YOU:   Understand  these instructions.  Will watch your condition.  Will get help right away if you are not doing well or get worse.   This information is not intended to replace advice given to you by your health care provider. Make sure you discuss any questions you have with your health care provider.   Document Released: 07/23/2004 Document Revised: 07/06/2014 Document Reviewed: 02/21/2012 Elsevier Interactive Patient Education 2016 [employer] Inc[employer]  Rapid Strep Test Strep throat is a bacterial infection caused by the bacteria Streptococcus pyogenes. A rapid strep test is the quickest way to check if these bacteria are causing your sore throat. The test can be done at your health care provider's office. Results are usually ready in 10-20 minutes. You may have this test if you have symptoms of strep throat. These include:   A red throat with yellow or white spots.  Neck swelling and tenderness.  Fever.  Loss of appetite.  Trouble breathing or swallowing.  Rash.  Dehydration. This test requires a sample of fluid from the back of your throat and tonsils. Your health care provider may hold down your tongue with a tongue depressor and use a swab to collect the sample.  Your health care provider may collect a second sample at the same time. The second sample may be used for a throat culture. In a culture test, the sample is combined with a substance that encourages bacteria to grow. It takes longer to get the results of the  throat culture test, but they are more accurate. They can confirm the results from a rapid strep test, or show that those results were wrong. RESULTS  It is your responsibility to obtain your test results. Ask the lab or department performing the test when and how you will get your results. Contact your health care provider to discuss any questions you have about your results.  The results of the rapid strep test will be negative or positive.  Meaning of Negative Test Results If the  result of your rapid strep test is negative, then it means:   It is likely that you do not have strep throat.  A virus may be causing your sore throat. Your health care provider may do a throat culture to confirm the results of the rapid strep test. The throat culture can also identify the different strains of strep bacteria. Meaning of Positive Test Results If the result of your rapid strep test is positive, then it means:  It is likely that you do have strep throat.  You may have to take antibiotics. Your health care provider may do a throat culture to confirm the results of the rapid strep test. Strep throat usually requires a course of antibiotics.    This information is not intended to replace advice given to you by your health care provider. Make sure you discuss any questions you have with your health care provider.   Document Released: 07/23/2004 Document Revised: 07/06/2014 Document Reviewed: 09/21/2013 Elsevier Interactive Patient Education [employer]

## 2016-01-11 NOTE — ED Provider Notes (Signed)
[facility] Emergency Department Provider Note  ____________________________________________  Time seen: Approximately 1:10 AM  I have reviewed the triage vital signs and the nursing notes.   HISTORY  Chief Complaint Sore Throat    HPI Hilton SinclairCarlton J Hollar is a 45 y.o. male who presents for evaluation of gradual onset but slightly worsening sore throat for about 24 hours.  He denies any difficulty swallowing but states he does have some pain when he swallows.  He denies fever/chills, chest pain, shortness of breath, nausea, vomiting, diarrhea.  He is not having any difficulty speaking or taking food or drink by mouth.  He is concerned because his mother is chronically ill with pulmonary fibrosis and he does not want to take strep throat to her and he has 5 kids who he thinks might have gotten him sick.  He has no other symptoms at this time.  He describes a sore throat is mild.  Nothing makes it better and eating or drinking makes it slightly worse.   Past Medical History  Diagnosis Date  . H/O degenerative disc disease   . Depression     Patient Active Problem List   Diagnosis Date Noted  . Major depressive disorder, recurrent severe without psychotic features (HCC) 01/04/2016  . Tobacco use disorder 01/04/2016  . CONSTIPATION 04/22/2010  . ABDOMINAL PAIN 04/22/2010  . LUMBAR RADICULOPATHY 08/05/2009  . HYPERLIPIDEMIA 10/25/2008  . DEPRESSION 10/25/2008  . MIGRAINE, COMMON 10/25/2008  . HYPERTENSION 10/25/2008  . HEMORRHOIDS 10/25/2008  . DENTAL CARIES 10/25/2008  . BLOOD IN STOOL 10/25/2008  . BACK PAIN 10/25/2008  . CARDIAC MURMUR 10/25/2008    Past Surgical History  Procedure Laterality Date  . Back surgery      Current Outpatient Rx  Name  Route  Sig  Dispense  Refill  . mirtazapine (REMERON) 15 MG tablet   Oral   Take 1 tablet (15 mg total) by mouth at bedtime.   30 tablet   1     Allergies Silver nitrate  No family history on  file.  Social History Social History  Substance Use Topics  . Smoking status: Current Every Day Smoker  . Smokeless tobacco: None  . Alcohol Use: No    Review of Systems Constitutional: No fever/chills Eyes: No visual changes. ENT: +sore throat. Cardiovascular: Denies chest pain. Respiratory: Denies shortness of breath. Gastrointestinal: No abdominal pain.  No nausea, no vomiting.  No diarrhea.  No constipation. Genitourinary: Negative for dysuria. Musculoskeletal: Negative for back pain. Skin: Negative for rash. Neurological: Negative for headaches, focal weakness or numbness.  10-point ROS otherwise negative.  ____________________________________________   PHYSICAL EXAM:  VITAL SIGNS: ED Triage Vitals  Enc Vitals Group     BP 01/10/16 2244 144/103 mmHg     Pulse Rate 01/10/16 2244 99     Resp 01/10/16 2244 18     Temp 01/10/16 2244 99 F (37.2 C)     Temp Source 01/10/16 2244 Oral     SpO2 01/10/16 2244 99 %     Weight 01/10/16 2244 160 lb (72.576 kg)     Height 01/10/16 2244 5\' 11"  (1.803 m)     Head Cir --      Peak Flow --      Pain Score 01/10/16 2245 3     Pain Loc --      Pain Edu? --      Excl. in GC? --     Constitutional: Alert and oriented. Well appearing  and in no acute distress. Eyes: Conjunctivae are normal. PERRL. EOMI. Head: Atraumatic. Nose: No congestion/rhinnorhea. Mouth/Throat: Mucous membranes are moist.  Oropharynx non-erythematous. No swelling or erythema or exudate on the tonsils.  No petechiae on the palate. Neck: No stridor.  No meningeal signs.  Mild bilateral cervical lymphadenopathy  Cardiovascular: Normal rate, regular rhythm. Good peripheral circulation. Grossly normal heart sounds.   Respiratory: Normal respiratory effort.  No retractions. Lungs CTAB. Neurologic:  Normal speech and language. No gross focal neurologic deficits are appreciated.  Psychiatric: Mood and affect are normal. Speech and behavior are  normal.  ____________________________________________   LABS (all labs ordered are listed, but only abnormal results are displayed)  Labs Reviewed  POCT RAPID STREP A   ____________________________________________  EKG  None ____________________________________________  RADIOLOGY   No results found.  ____________________________________________   PROCEDURES  Procedure(s) performed:   Procedures   ____________________________________________   INITIAL IMPRESSION / ASSESSMENT AND PLAN / ED COURSE  Pertinent labs & imaging results that were available during my care of the patient were reviewed by me and considered in my medical decision making (see chart for details).  Well appearing, normal vitals, suspect viral syndrome causing sore throat but no evidence of acute bacterial pharyngitis.  Strep was negative, culture pending.   I gave my usual and customary return precautions.      ____________________________________________  FINAL CLINICAL IMPRESSION(S) / ED DIAGNOSES  Final diagnoses:  Sore throat (viral)     MEDICATIONS GIVEN DURING THIS VISIT:  Medications - No data to display   NEW OUTPATIENT MEDICATIONS STARTED DURING THIS VISIT:  Discharge Medication List as of 01/11/2016  1:28 AM        Note:  This document was prepared using Dragon voice recognition software and may include unintentional dictation errors.   Loleta Rose, MD 01/11/16 [phone]

## 2016-03-03 ENCOUNTER — Encounter: Payer: Self-pay | Admitting: Emergency Medicine

## 2016-03-03 ENCOUNTER — Emergency Department: Payer: Self-pay

## 2016-03-03 DIAGNOSIS — I10 Essential (primary) hypertension: Secondary | ICD-10-CM | POA: Insufficient documentation

## 2016-03-03 DIAGNOSIS — F17200 Nicotine dependence, unspecified, uncomplicated: Secondary | ICD-10-CM | POA: Insufficient documentation

## 2016-03-03 DIAGNOSIS — Z5321 Procedure and treatment not carried out due to patient leaving prior to being seen by health care provider: Secondary | ICD-10-CM | POA: Insufficient documentation

## 2016-03-03 LAB — BASIC METABOLIC PANEL
ANION GAP: 8 (ref 5–15)
BUN: 8 mg/dL (ref 6–20)
CALCIUM: 8.9 mg/dL (ref 8.9–10.3)
CO2: 24 mmol/L (ref 22–32)
CREATININE: 0.96 mg/dL (ref 0.61–1.24)
Chloride: 99 mmol/L — ABNORMAL LOW (ref 101–111)
GFR calc Af Amer: >60 mL/min (ref >60)
GLUCOSE: 98 mg/dL (ref 65–99)
Potassium: 3 mmol/L — ABNORMAL LOW (ref 3.5–5.1)
Sodium: 131 mmol/L — ABNORMAL LOW (ref 135–145)

## 2016-03-03 LAB — CBC
HCT: 39.6 % — ABNORMAL LOW (ref 40.0–52.0)
HEMOGLOBIN: 14 g/dL (ref 13.0–18.0)
MCH: 30.8 pg (ref 26.0–34.0)
MCHC: 35.5 g/dL (ref 32.0–36.0)
MCV: 86.8 fL (ref 80.0–100.0)
PLATELETS: 181 10*3/uL (ref 150–440)
RBC: 4.56 MIL/uL (ref 4.40–5.90)
RDW: 14 % (ref 11.5–14.5)
WBC: 8.3 10*3/uL (ref 3.8–10.6)

## 2016-03-03 LAB — TROPONIN I

## 2016-03-03 NOTE — ED Triage Notes (Signed)
Pt reports ambulated to [facility], reports vital signs were taken and was told blood pressure was elevated. Pt reports ambulated to gas station and began to have chest pain, headache, and weakness at 19:00 tonight. Reports is no longer having chest pian but reports having slight headache. Denies vision changes.

## 2016-03-04 ENCOUNTER — Emergency Department
Admission: EM | Admit: 2016-03-04 | Discharge: 2016-03-04 | Disposition: A | Discharge status: Home / Self Care | Payer: Self-pay | Attending: Emergency Medicine | Admitting: Emergency Medicine

## 2016-03-04 ENCOUNTER — Telehealth: Payer: Self-pay | Admitting: Emergency Medicine

## 2016-03-04 NOTE — Telephone Encounter (Signed)
Planned to call patient to ask about follow up plans due to left without being seen by provider.  He does not have a valid phone number on chart.

## 2017-01-03 ENCOUNTER — Emergency Department
Admission: EM | Admit: 2017-01-03 | Discharge: 2017-01-03 | Disposition: A | Discharge status: Home / Self Care | Payer: Self-pay | Attending: Emergency Medicine | Admitting: Emergency Medicine

## 2017-01-03 ENCOUNTER — Encounter: Payer: Self-pay | Admitting: Medical Oncology

## 2017-01-03 ENCOUNTER — Emergency Department: Payer: Self-pay

## 2017-01-03 DIAGNOSIS — Y990 Civilian activity done for income or pay: Secondary | ICD-10-CM | POA: Insufficient documentation

## 2017-01-03 DIAGNOSIS — Z79899 Other long term (current) drug therapy: Secondary | ICD-10-CM | POA: Insufficient documentation

## 2017-01-03 DIAGNOSIS — S92315A Nondisplaced fracture of first metatarsal bone, left foot, initial encounter for closed fracture: Secondary | ICD-10-CM | POA: Insufficient documentation

## 2017-01-03 DIAGNOSIS — W208XXA Other cause of strike by thrown, projected or falling object, initial encounter: Secondary | ICD-10-CM | POA: Insufficient documentation

## 2017-01-03 DIAGNOSIS — F17200 Nicotine dependence, unspecified, uncomplicated: Secondary | ICD-10-CM | POA: Insufficient documentation

## 2017-01-03 DIAGNOSIS — Y939 Activity, unspecified: Secondary | ICD-10-CM | POA: Insufficient documentation

## 2017-01-03 DIAGNOSIS — Y929 Unspecified place or not applicable: Secondary | ICD-10-CM | POA: Insufficient documentation

## 2017-01-03 DIAGNOSIS — I10 Essential (primary) hypertension: Secondary | ICD-10-CM | POA: Insufficient documentation

## 2017-01-03 NOTE — ED Notes (Signed)
Pt hypertensive at discharge, provider instructed pt to follow up with pcp, and pt agreed.

## 2017-01-03 NOTE — ED Provider Notes (Signed)
[facility] Emergency Department Provider Note  ____________________________________________  Time seen: Approximately 8:55 AM  I have reviewed the triage vital signs and the nursing notes.   HISTORY  Chief Complaint needs ortho referal    HPI Noah Avery is a 46 y.o. male that presents to the emergency department with known broken foot since Thursday. Patient was in [place] for work when 1500 pounds of steel fell on his left foot. He was told in [place] that his foot was broken and would potentially need surgery. He was going to return to [place] before score surgery could be scheduled so was told to follow up with orthopedics here.He has taken the splint off and swelling has improved since Thursday. He denies fever, shortness breath, chest pain, nausea, vomiting, abdominal pain, numbness, tingling.   Past Medical History:  Diagnosis Date  . Depression   . H/O degenerative disc disease     Patient Active Problem List   Diagnosis Date Noted  . Major depressive disorder, recurrent severe without psychotic features (HCC) 01/04/2016  . Tobacco use disorder 01/04/2016  . CONSTIPATION 04/22/2010  . ABDOMINAL PAIN 04/22/2010  . LUMBAR RADICULOPATHY 08/05/2009  . HYPERLIPIDEMIA 10/25/2008  . DEPRESSION 10/25/2008  . MIGRAINE, COMMON 10/25/2008  . HYPERTENSION 10/25/2008  . HEMORRHOIDS 10/25/2008  . DENTAL CARIES 10/25/2008  . BLOOD IN STOOL 10/25/2008  . BACK PAIN 10/25/2008  . CARDIAC MURMUR 10/25/2008    Past Surgical History:  Procedure Laterality Date  . BACK SURGERY      Prior to Admission medications   Medication Sig Start Date End Date Taking? Authorizing Provider  mirtazapine (REMERON) 15 MG tablet Take 1 tablet (15 mg total) by mouth at bedtime. 01/06/16   Pucilowska, Ellin GoodieJolanta B, MD    Allergies Silver nitrate  No family history on file.  Social History Social History  Substance Use Topics  . Smoking status: Current  Every Day Smoker    Packs/day: 1.00  . Smokeless tobacco: Never Used  . Alcohol use Yes     Comment: occasional     Review of Systems  Constitutional: No fever/chills Cardiovascular: No chest pain. Respiratory: No SOB. Gastrointestinal: No abdominal pain.  No nausea, no vomiting.  Musculoskeletal: Positive for foot pain. Skin: Negative for rash, abrasions, lacerations. Positive for foot ecchymosis. Neurological: Negative for headaches, numbness or tingling   ____________________________________________   PHYSICAL EXAM:  VITAL SIGNS: ED Triage Vitals  Enc Vitals Group     BP 01/03/17 0842 (!) 181/109     Pulse Rate 01/03/17 0842 95     Resp 01/03/17 0842 18     Temp 01/03/17 0842 97.8 F (36.6 C)     Temp Source 01/03/17 0842 Oral     SpO2 01/03/17 0842 100 %     Weight 01/03/17 0838 140 lb (63.5 kg)     Height 01/03/17 0838 5\' 10"  (1.778 m)     Head Circumference --      Peak Flow --      Pain Score --      Pain Loc --      Pain Edu? --      Excl. in GC? --      Constitutional: Alert and oriented. Well appearing and in no acute distress. Using crutches. Eyes: Conjunctivae are normal. PERRL. EOMI. Head: Atraumatic. ENT:      Ears:      Nose: No congestion/rhinnorhea.      Mouth/Throat: Mucous membranes are moist.  Neck: No stridor.  Cardiovascular: Normal rate, regular rhythm.  Good peripheral circulation. Respiratory: Normal respiratory effort without tachypnea or retractions. Lungs CTAB. Good air entry to the bases with no decreased or absent breath sounds. Musculoskeletal: Full range of motion to all extremities. No gross deformities appreciated. Left foot splint in place.  Neurologic:  Normal speech and language. No gross focal neurologic deficits are appreciated.  Skin:  Skin is warm, dry and intact. No rash noted. Psychiatric: Mood and affect are normal. Speech and behavior are normal. Patient exhibits appropriate insight and  judgement.   ____________________________________________   LABS (all labs ordered are listed, but only abnormal results are displayed)  Labs Reviewed - No data to display ____________________________________________  EKG   ____________________________________________  RADIOLOGY Lexine Baton, personally viewed and evaluated these images (plain radiographs) as part of my medical decision making, as well as reviewing the written report by the radiologist.  Dg Foot Complete Left  Result Date: 01/03/2017 CLINICAL DATA:  Foot fracture on Thursday in [place]. Subsequent encounter. EXAM: LEFT FOOT - COMPLETE 3+ VIEW COMPARISON:  None. FINDINGS: There is a transverse fracture through the neck/distal shaft of the first metatarsal ventral impaction causing approximately 50 degrees of angulation when measured on the lateral view. The medial first metatarsal head somewhat flattened and may also be injured. No dislocation. IMPRESSION: Acute fracture at the neck/ distal shaft of the first metatarsal with dorsal impaction. Electronically Signed   By: Marnee Spring M.D.   On: 01/03/2017 09:10    ____________________________________________    PROCEDURES  Procedure(s) performed:    Procedures    Medications - No data to display   ____________________________________________   INITIAL IMPRESSION / ASSESSMENT AND PLAN / ED COURSE  Pertinent labs & imaging results that were available during my care of the patient were reviewed by me and considered in my medical decision making (see chart for details).  Review of the [place] CSRS was performed in accordance of the [facility] prior to dispensing any controlled drugs.   Patient's diagnosis is consistent with metatarsal fracture. Vital signs and exam are reassuring. X-ray consistent with fracture. Patient's blood pressure is high and states that he has seen primary care for this in the past but has chosen not to take medication. He agrees to  follow up with primary care.  Patient is to follow up with orthopedics as directed. Patient is given ED precautions to return to the ED for any worsening or new symptoms.     ____________________________________________  FINAL CLINICAL IMPRESSION(S) / ED DIAGNOSES  Final diagnoses:  Closed nondisplaced fracture of first metatarsal bone of left foot, initial encounter      NEW MEDICATIONS STARTED DURING THIS VISIT:  Discharge Medication List as of 01/03/2017  9:34 AM          This chart was dictated using voice recognition software/Dragon. Despite best efforts to proofread, errors can occur which can change the meaning. Any change was purely unintentional.    Enid Derry, PA-C 01/03/17 1013    Phineas Semen, MD 01/03/17 [phone]

## 2017-01-03 NOTE — ED Triage Notes (Signed)
Pt reports that he broke his foot Thursday in [place], and was given referral to ortho there but pt reports that his Workers comp requires him to have ortho referral here. Pt in NAD. Ambulating with crutches.

## 2017-12-28 ENCOUNTER — Other Ambulatory Visit: Payer: Self-pay

## 2017-12-28 ENCOUNTER — Encounter: Payer: Self-pay | Admitting: Emergency Medicine

## 2017-12-28 DIAGNOSIS — M5441 Lumbago with sciatica, right side: Secondary | ICD-10-CM | POA: Insufficient documentation

## 2017-12-28 DIAGNOSIS — F17200 Nicotine dependence, unspecified, uncomplicated: Secondary | ICD-10-CM | POA: Insufficient documentation

## 2017-12-28 DIAGNOSIS — I10 Essential (primary) hypertension: Secondary | ICD-10-CM | POA: Insufficient documentation

## 2017-12-28 NOTE — ED Triage Notes (Addendum)
Patient ambulatory to triage with steady gait, without difficulty or distress noted; pt reports lower back pain radiating into buttocks and left leg with no known injury since 3pm; denies any accomp symptoms; denies hx of same

## 2017-12-29 ENCOUNTER — Other Ambulatory Visit: Payer: Self-pay

## 2017-12-29 ENCOUNTER — Emergency Department
Admission: EM | Admit: 2017-12-29 | Discharge: 2017-12-29 | Disposition: A | Discharge status: Home / Self Care | Payer: Self-pay | Attending: Emergency Medicine | Admitting: Emergency Medicine

## 2017-12-29 DIAGNOSIS — M5432 Sciatica, left side: Secondary | ICD-10-CM

## 2017-12-29 MED ORDER — CYCLOBENZAPRINE HCL 10 MG PO TABS
ORAL_TABLET | ORAL | Status: AC
  Administered 2017-12-29: 10 mg via ORAL
  Filled 2017-12-29: qty 1

## 2017-12-29 MED ORDER — CYCLOBENZAPRINE HCL 10 MG PO TABS
10.0000 mg | ORAL_TABLET | Freq: Once | ORAL | Status: AC
  Administered 2017-12-29: 10 mg via ORAL

## 2017-12-29 MED ORDER — LIDOCAINE 5 % EX PTCH
1.0000 | MEDICATED_PATCH | CUTANEOUS | Status: DC
  Administered 2017-12-29: 1 via TRANSDERMAL

## 2017-12-29 MED ORDER — CYCLOBENZAPRINE HCL 10 MG PO TABS: 10.0000 mg | ORAL_TABLET | Freq: Three times a day (TID) | ORAL | 0 refills | Status: DC | PRN

## 2017-12-29 MED ORDER — LIDOCAINE 5 % EX PTCH
MEDICATED_PATCH | CUTANEOUS | Status: AC
  Administered 2017-12-29: 1 via TRANSDERMAL
  Filled 2017-12-29: qty 1

## 2017-12-29 NOTE — ED Provider Notes (Signed)
[facility] Emergency Department Provider Note  Time seen: 12:20 AM  I have reviewed the triage vital signs and the nursing notes.   HISTORY  Chief Complaint Back Pain    HPI Noah Avery is a 47 y.o. male with below list of chronic medical conditions including previous lumbar spine surgery presents to the emergency department with acute onset of low back pain with radiation down the posterior left leg with onset today.  Patient states his current pain score is 2-3 out of 10.  Patient denies any lower extremity weakness numbness or gait instability.  Patient denies any urinary or bowel habit change legs.  No incontinence.  Patient denies any fever   Past Medical History:  Diagnosis Date  . Depression   . H/O degenerative disc disease     Patient Active Problem List   Diagnosis Date Noted  . Major depressive disorder, recurrent severe without psychotic features (HCC) 01/04/2016  . Tobacco use disorder 01/04/2016  . CONSTIPATION 04/22/2010  . ABDOMINAL PAIN 04/22/2010  . LUMBAR RADICULOPATHY 08/05/2009  . HYPERLIPIDEMIA 10/25/2008  . DEPRESSION 10/25/2008  . MIGRAINE, COMMON 10/25/2008  . HYPERTENSION 10/25/2008  . HEMORRHOIDS 10/25/2008  . DENTAL CARIES 10/25/2008  . BLOOD IN STOOL 10/25/2008  . BACK PAIN 10/25/2008  . CARDIAC MURMUR 10/25/2008    Past Surgical History:  Procedure Laterality Date  . BACK SURGERY      Prior to Admission medications   Medication Sig Start Date End Date Taking? Authorizing Provider  mirtazapine (REMERON) 15 MG tablet Take 1 tablet (15 mg total) by mouth at bedtime. 01/06/16   Pucilowska, Ellin GoodieJolanta B, MD    Allergies Silver nitrate  No family history on file.  Social History Social History   Tobacco Use  . Smoking status: Current Every Day Smoker    Packs/day: 1.00  . Smokeless tobacco: Never Used  Substance Use Topics  . Alcohol use: Yes    Comment: occasional  . Drug use: No    Review of  Systems Constitutional: No fever/chills Eyes: No visual changes. ENT: No sore throat. Cardiovascular: Denies chest pain. Respiratory: Denies shortness of breath. Gastrointestinal: No abdominal pain.  No nausea, no vomiting.  No diarrhea.  No constipation. Genitourinary: Negative for dysuria. Musculoskeletal: Negative for neck pain.  Positive for back pain. Integumentary: Negative for rash. Neurological: Negative for headaches, focal weakness or numbness.  ____________________________________________   PHYSICAL EXAM:  VITAL SIGNS: ED Triage Vitals [12/28/17 2310]  Enc Vitals Group     BP 138/86     Pulse Rate 99     Resp 18     Temp 98.5 F (36.9 C)     Temp Source Oral     SpO2 95 %     Weight 72.2 kg (159 lb 1.6 oz)     Height 1.778 m (5\' 10" )     Head Circumference      Peak Flow      Pain Score 2     Pain Loc      Pain Edu?      Excl. in GC?     Constitutional: Alert and oriented. Well appearing and in no acute distress. Eyes: Conjunctivae are normal. Head: Atraumatic. Mouth/Throat: Mucous membranes are moist.  Oropharynx non-erythematous. Neck: No stridor.   Cardiovascular: Normal rate, regular rhythm. Good peripheral circulation. Grossly normal heart sounds. Respiratory: Normal respiratory effort.  No retractions. Lungs CTAB. Gastrointestinal: Soft and nontender. No distention.  Musculoskeletal: No lower extremity tenderness nor  edema. No gross deformities of extremities.  Positive left straight leg raise Neurologic:  Normal speech and language. No gross focal neurologic deficits are appreciated.  Skin:  Skin is warm, dry and intact. No rash noted.     Procedures   ____________________________________________   INITIAL IMPRESSION / ASSESSMENT AND PLAN / ED COURSE  As part of my medical decision making, I reviewed the following data within the electronic MEDICAL RECORD NUMBER   47 year old male presented with above-stated history and physical exam  concerning for sciatica.  Lidoderm patch applied to the patient lumbar spine and Flexeril given _________________________  FINAL CLINICAL IMPRESSION(S) / ED DIAGNOSES  Final diagnoses:  Sciatica of left side     MEDICATIONS GIVEN DURING THIS VISIT:  Medications - No data to display   ED Discharge Orders    None       Note:  This document was prepared using Dragon voice recognition software and may include unintentional dictation errors.    Darci Current, MD 12/29/17 [phone]

## 2017-12-29 NOTE — ED Notes (Signed)
Pt to the er for pain to the lower back and into the left buttock and down the back of the left leg. No meds taken prior to arrival.

## 2018-02-23 ENCOUNTER — Encounter: Payer: Self-pay | Admitting: *Deleted

## 2018-02-23 ENCOUNTER — Emergency Department: Payer: Self-pay

## 2018-02-23 ENCOUNTER — Other Ambulatory Visit: Payer: Self-pay

## 2018-02-23 ENCOUNTER — Emergency Department
Admission: EM | Admit: 2018-02-23 | Discharge: 2018-02-23 | Disposition: A | Discharge status: Home / Self Care | Payer: Self-pay | Attending: Emergency Medicine | Admitting: Emergency Medicine

## 2018-02-23 DIAGNOSIS — R002 Palpitations: Secondary | ICD-10-CM

## 2018-02-23 DIAGNOSIS — R61 Generalized hyperhidrosis: Secondary | ICD-10-CM

## 2018-02-23 DIAGNOSIS — F17210 Nicotine dependence, cigarettes, uncomplicated: Secondary | ICD-10-CM | POA: Insufficient documentation

## 2018-02-23 DIAGNOSIS — I10 Essential (primary) hypertension: Secondary | ICD-10-CM | POA: Insufficient documentation

## 2018-02-23 DIAGNOSIS — R000 Tachycardia, unspecified: Secondary | ICD-10-CM

## 2018-02-23 DIAGNOSIS — Z720 Tobacco use: Secondary | ICD-10-CM

## 2018-02-23 LAB — CBC
HEMATOCRIT: 43.1 % (ref 40.0–52.0)
Hemoglobin: 14.9 g/dL (ref 13.0–18.0)
MCH: 31.6 pg (ref 26.0–34.0)
MCHC: 34.7 g/dL (ref 32.0–36.0)
MCV: 91.1 fL (ref 80.0–100.0)
Platelets: 210 10*3/uL (ref 150–440)
RBC: 4.73 MIL/uL (ref 4.40–5.90)
RDW: 13.7 % (ref 11.5–14.5)
WBC: 12.7 10*3/uL — AB (ref 3.8–10.6)

## 2018-02-23 LAB — BASIC METABOLIC PANEL
Anion gap: 7 (ref 5–15)
BUN: 12 mg/dL (ref 6–20)
CHLORIDE: 106 mmol/L (ref 98–111)
CO2: 23 mmol/L (ref 22–32)
CREATININE: 0.87 mg/dL (ref 0.61–1.24)
Calcium: 8.9 mg/dL (ref 8.9–10.3)
GFR calc Af Amer: >60 mL/min (ref >60)
Glucose, Bld: 99 mg/dL (ref 70–99)
Potassium: 3.8 mmol/L (ref 3.5–5.1)
SODIUM: 136 mmol/L (ref 135–145)

## 2018-02-23 LAB — TSH: TSH: 0.593 u[IU]/mL (ref 0.350–4.500)

## 2018-02-23 LAB — TROPONIN I
Troponin I: <0.03 ng/mL (ref <0.03)
Troponin I: <0.03 ng/mL (ref <0.03)

## 2018-02-23 LAB — MAGNESIUM: MAGNESIUM: 2.2 mg/dL (ref 1.7–2.4)

## 2018-02-23 MED ORDER — SODIUM CHLORIDE 0.9 % IV BOLUS
1000.0000 mL | Freq: Once | INTRAVENOUS | Status: AC
  Administered 2018-02-23: 1000 mL via INTRAVENOUS

## 2018-02-23 NOTE — ED Provider Notes (Addendum)
[facility] Emergency Department Provider Note  ____________________________________________  Time seen: Approximately 5:13 PM  I have reviewed the triage vital signs and the nursing notes.   HISTORY  Chief Complaint Tachycardia    HPI Noah Avery is a 47 y.o. male history of depression presenting for palpitations.  The patient reports that for the past 15 years, approximately once weekly he has an episode of "3-4 thumps" in the left chest that resolves spontaneously.  He was seen 15 years ago for the same with an event monitor but reports a negative work-up.  He does smoke, denies cocaine, but does consume a large amount of caffeine daily.  Today, he was in line to pay for drink at the gas station when he developed similar palpitations, lightheaded sensation and "profuse sweating."  He did not have any chest pain or pressure, and he did not pass out.  He was able to drive home but was concerned that this event was worse than his prior episodes so came here for further evaluation.  He does not follow with a cardiologist.  At this time, the patient is asymptomatic.  SH: + tobacco, neg cocaine, quit ETOH 4y ago  FH: No personal or family history of blood clots.  Past Medical History:  Diagnosis Date  . Depression   . H/O degenerative disc disease     Patient Active Problem List   Diagnosis Date Noted  . Major depressive disorder, recurrent severe without psychotic features (HCC) 01/04/2016  . Tobacco use disorder 01/04/2016  . CONSTIPATION 04/22/2010  . ABDOMINAL PAIN 04/22/2010  . LUMBAR RADICULOPATHY 08/05/2009  . HYPERLIPIDEMIA 10/25/2008  . DEPRESSION 10/25/2008  . MIGRAINE, COMMON 10/25/2008  . HYPERTENSION 10/25/2008  . HEMORRHOIDS 10/25/2008  . DENTAL CARIES 10/25/2008  . BLOOD IN STOOL 10/25/2008  . BACK PAIN 10/25/2008  . CARDIAC MURMUR 10/25/2008    Past Surgical History:  Procedure Laterality Date  . BACK SURGERY      Current  Outpatient Rx  . Order #: [id number] Class: Print  . Order #: [id number] Class: Print    Allergies Silver nitrate  History reviewed. No pertinent family history.  Social History Social History   Tobacco Use  . Smoking status: Current Every Day Smoker    Packs/day: 1.00  . Smokeless tobacco: Never Used  Substance Use Topics  . Alcohol use: Yes    Comment: occasional  . Drug use: No    Review of Systems Constitutional: No fever/chills.  As of lightheadedness without syncope.  Positive diaphoresis. Eyes: No visual changes.  No blurred or double vision. ENT: No sore throat. No congestion or rhinorrhea. Cardiovascular: Denies chest pain.  Positive palpitations. Respiratory: Denies shortness of breath.  No cough. Gastrointestinal: No abdominal pain.  No nausea, no vomiting.  No diarrhea.  No constipation. Genitourinary: Negative for dysuria. Musculoskeletal: Negative for back pain. Skin: Negative for rash. Neurological: Negative for headaches. No focal numbness, tingling or weakness.     ____________________________________________   PHYSICAL EXAM:  VITAL SIGNS: ED Triage Vitals  Enc Vitals Group     BP 02/23/18 1503 114/89     Pulse Rate 02/23/18 1503 (!) 116     Resp 02/23/18 1503 16     Temp 02/23/18 1503 98.7 F (37.1 C)     Temp Source 02/23/18 1503 Oral     SpO2 02/23/18 1503 98 %     Weight 02/23/18 1501 160 lb (72.6 kg)     Height 02/23/18 1501 5\' 10"  (1.778 m)  Head Circumference --      Peak Flow --      Pain Score 02/23/18 1501 0     Pain Loc --      Pain Edu? --      Excl. in GC? --     Constitutional: Alert and oriented. Answers questions appropriately. Eyes: Conjunctivae are normal.  EOMI. No scleral icterus. Head: Atraumatic. Nose: No congestion/rhinnorhea. Mouth/Throat: Mucous membranes are moist.  Poor dentition diffusely. Neck: No stridor.  Supple.  No JVD.  No meningismus. Cardiovascular: Normal rate, regular rhythm. No murmurs, rubs or  gallops.  Respiratory: Normal respiratory effort.  No accessory muscle use or retractions. Lungs CTAB.  No wheezes, rales or ronchi. Gastrointestinal: Soft, nontender and nondistended.  No guarding or rebound.  No peritoneal signs. Musculoskeletal: No LE edema. No ttp in the calves or palpable cords.  Negative Homan's sign. Neurologic:  A&Ox3.  Speech is clear.  Face and smile are symmetric.  EOMI.  Moves all extremities well. Skin:  Skin is warm, dry and intact. No rash noted. Psychiatric: Mood and affect are normal. Speech and behavior are normal.  Normal judgement.  ____________________________________________   LABS (all labs ordered are listed, but only abnormal results are displayed)  Labs Reviewed  CBC - Abnormal; Notable for the following components:      Result Value   WBC 12.7 (*)    All other components within normal limits  BASIC METABOLIC PANEL  TROPONIN I  MAGNESIUM  TSH  TROPONIN I   ____________________________________________  EKG  ED ECG REPORT I, Anne-Caroline Sharma Covert, the attending physician, personally viewed and interpreted this ECG.   Date: 02/23/2018  EKG Time: 1458  Rate: 122  Rhythm: sinus tachycardia  Axis: normal  Intervals:none  ST&T Change: No STEMI   Repeat EKG: ED ECG REPORT I, Anne-Caroline Sharma Covert, the attending physician, personally viewed and interpreted this ECG.   Date: 02/23/2018  EKG Time:   Rate: 1720  Rhythm: normal sinus rhythm  Axis: normal  Intervals:none  ST&T Change: Nonspecific T wave inversion in V1.  No STEMI.  ____________________________________________  RADIOLOGY  Dg Chest 2 View  Result Date: 02/23/2018 CLINICAL DATA:  Tachycardia EXAM: CHEST - 2 VIEW COMPARISON:  03/03/2016 FINDINGS: The heart size and mediastinal contours are within normal limits. Both lungs are clear. The visualized skeletal structures are unremarkable. IMPRESSION: No active cardiopulmonary disease. Electronically Signed   By: Tollie Eth  M.D.   On: 02/23/2018 15:50    ____________________________________________   PROCEDURES  Procedure(s) performed: None  Procedures  Critical Care performed: No ____________________________________________   INITIAL IMPRESSION / ASSESSMENT AND PLAN / ED COURSE  Pertinent labs & imaging results that were available during my care of the patient were reviewed by me and considered in my medical decision making (see chart for details).  47 y.o. male with once weekly palpitations presenting for an episode which was worse than usual, now associated with lightheadedness and diaphoresis.  Overall, the patient is he medically stable.  Initially upon presentation he did have a tachycardia to 122 which looks like a sinus tachycardia without any evidence of Brugada syndrome, prolonged QTC, or significant hypertrophy.  He does ingest a large amount of caffeine and this may be caffeine induced.  Patient's electrolytes are within normal limits, his blood counts are reassuring, and his first troponin is negative.  His EKG does not show ischemic changes and a second troponin is pending. The patient's chest x-ray does not show any active cardiopulmonary disease.  Magnesium and TSH are pending.  If the patient's work-up in the emergency department continues to be reassuring, we will plan to discharge him home with close cardiology follow-up.  We discussed follow-up instructions as well as return precautions.  ----------------------------------------- 7:48 PM on 02/23/2018 -----------------------------------------  Patient's work-up in the emergency department has been reassuring.  He has remained hemodynamically stable and has a heart rate in the 70s since my evaluation.  His work-up shows normal electrolytes including magnesium, normal TSH, and troponin that is negative.  At this time, the patient is stable for discharge home.  I will refer him to follow-up with cardiology.  We discussed return  precautions.  ____________________________________________  FINAL CLINICAL IMPRESSION(S) / ED DIAGNOSES  Final diagnoses:  Sinus tachycardia  Palpitations  Diaphoresis  Tobacco abuse         NEW MEDICATIONS STARTED DURING THIS VISIT:  New Prescriptions   No medications on file      Rockne Menghini, MD 02/23/18 1815    Rockne Menghini, MD 02/23/18 1950

## 2018-02-23 NOTE — ED Triage Notes (Signed)
Pt reports he was driving to work this morning at 8:00 and felt flushed suddenly and reports a near syncopal episode. Pt reports having been resting at home since but has felt no improvement. Pt reports he has had similar events in the past but has not been able to determine a cause. Vomiting reported this morning but no continued nausea. No weakness.

## 2018-02-23 NOTE — Discharge Instructions (Addendum)
These stop drinking caffeine.  Please drink plenty of fluids stay well-hydrated.  Please try to decrease your smoking.  Return to the emergency department if you develop chest pain, palpitations, lightheadedness or fainting, shortness of breath, or any other symptoms concerning to you.

## 2018-02-23 NOTE — ED Notes (Addendum)
Pt states this AM he felt like his heart was racing. Hx of same with no diagnosis, denies irregular HR. States he took a nap and HR came down. States drinks a lot of caffeine and smokes. Quit drinking. States had some SOB earlier but denies CP. Alert, oriented, NSR on monitor. No distress noted at this time.

## 2018-02-25 ENCOUNTER — Encounter: Payer: Self-pay | Admitting: Emergency Medicine

## 2018-02-25 ENCOUNTER — Other Ambulatory Visit: Payer: Self-pay

## 2018-02-25 ENCOUNTER — Emergency Department
Admission: EM | Admit: 2018-02-25 | Discharge: 2018-02-25 | Disposition: A | Discharge status: Home / Self Care | Payer: Self-pay | Attending: Emergency Medicine | Admitting: Emergency Medicine

## 2018-02-25 ENCOUNTER — Emergency Department: Payer: Self-pay

## 2018-02-25 DIAGNOSIS — R0789 Other chest pain: Secondary | ICD-10-CM | POA: Insufficient documentation

## 2018-02-25 DIAGNOSIS — Z5321 Procedure and treatment not carried out due to patient leaving prior to being seen by health care provider: Secondary | ICD-10-CM | POA: Insufficient documentation

## 2018-02-25 DIAGNOSIS — R0602 Shortness of breath: Secondary | ICD-10-CM | POA: Insufficient documentation

## 2018-02-25 LAB — BASIC METABOLIC PANEL
ANION GAP: 5 (ref 5–15)
BUN: 7 mg/dL (ref 6–20)
CALCIUM: 8.9 mg/dL (ref 8.9–10.3)
CO2: 27 mmol/L (ref 22–32)
Chloride: 105 mmol/L (ref 98–111)
Creatinine, Ser: 0.98 mg/dL (ref 0.61–1.24)
Glucose, Bld: 93 mg/dL (ref 70–99)
POTASSIUM: 3.5 mmol/L (ref 3.5–5.1)
Sodium: 137 mmol/L (ref 135–145)

## 2018-02-25 LAB — CBC
HEMATOCRIT: 41 % (ref 40.0–52.0)
HEMOGLOBIN: 14.1 g/dL (ref 13.0–18.0)
MCH: 31.4 pg (ref 26.0–34.0)
MCHC: 34.3 g/dL (ref 32.0–36.0)
MCV: 91.5 fL (ref 80.0–100.0)
Platelets: 203 10*3/uL (ref 150–440)
RBC: 4.48 MIL/uL (ref 4.40–5.90)
RDW: 13.4 % (ref 11.5–14.5)
WBC: 7.2 10*3/uL (ref 3.8–10.6)

## 2018-02-25 LAB — TROPONIN I

## 2018-02-25 NOTE — ED Triage Notes (Addendum)
Pt presents to ED with left sided chest pain/ tightness. Seen Wednesday for similar symptoms. Pain has been occurring intermittently for the past 10 years. Pt states he never had insurance to get it checked out. Pt states he attempted to make follow up appt but wont be seen until October. Denies nausea and reports "a little" sob. No increased work of breathing or acute distress noted at this time.

## 2018-03-02 ENCOUNTER — Telehealth: Payer: Self-pay | Admitting: Emergency Medicine

## 2018-03-02 NOTE — Telephone Encounter (Signed)
Called patient due to lwot to inquire about condition and follow up plans. Left message.   

## 2018-03-10 ENCOUNTER — Emergency Department: Payer: Self-pay

## 2018-03-10 ENCOUNTER — Other Ambulatory Visit: Payer: Self-pay

## 2018-03-10 ENCOUNTER — Emergency Department
Admission: EM | Admit: 2018-03-10 | Discharge: 2018-03-10 | Disposition: A | Discharge status: Home / Self Care | Payer: Self-pay | Attending: Emergency Medicine | Admitting: Emergency Medicine

## 2018-03-10 DIAGNOSIS — R079 Chest pain, unspecified: Secondary | ICD-10-CM | POA: Insufficient documentation

## 2018-03-10 LAB — COMPREHENSIVE METABOLIC PANEL
ALBUMIN: 4.1 g/dL (ref 3.5–5.0)
ALT: 18 U/L (ref 0–44)
AST: 21 U/L (ref 15–41)
Alkaline Phosphatase: 72 U/L (ref 38–126)
Anion gap: 6 (ref 5–15)
BILIRUBIN TOTAL: 0.4 mg/dL (ref 0.3–1.2)
CHLORIDE: 106 mmol/L (ref 98–111)
CO2: 26 mmol/L (ref 22–32)
CREATININE: 0.9 mg/dL (ref 0.61–1.24)
Calcium: 8.7 mg/dL — ABNORMAL LOW (ref 8.9–10.3)
GFR calc Af Amer: >60 mL/min (ref >60)
GLUCOSE: 100 mg/dL — AB (ref 70–99)
POTASSIUM: 3.2 mmol/L — AB (ref 3.5–5.1)
Sodium: 138 mmol/L (ref 135–145)
Total Protein: 7.1 g/dL (ref 6.5–8.1)

## 2018-03-10 LAB — CBC
HEMATOCRIT: 40.4 % (ref 40.0–52.0)
Hemoglobin: 14 g/dL (ref 13.0–18.0)
MCH: 31.5 pg (ref 26.0–34.0)
MCHC: 34.6 g/dL (ref 32.0–36.0)
MCV: 91 fL (ref 80.0–100.0)
PLATELETS: 180 10*3/uL (ref 150–440)
RBC: 4.44 MIL/uL (ref 4.40–5.90)
RDW: 13.5 % (ref 11.5–14.5)
WBC: 13 10*3/uL — ABNORMAL HIGH (ref 3.8–10.6)

## 2018-03-10 LAB — TROPONIN I: Troponin I: <0.03 ng/mL (ref <0.03)

## 2018-03-10 NOTE — ED Notes (Signed)
See paper chart for triage, assessment and progress notes due to down time.

## 2018-03-10 NOTE — ED Triage Notes (Signed)
See paper chart for downtime documentation 

## 2018-03-10 NOTE — ED Provider Notes (Signed)
[facility] Emergency Department Provider Note   First MD Initiated Contact with Patient 03/10/18 [phone]     (approximate)  I have reviewed the triage vital signs and the nursing notes.   HISTORY  Chief Complaint Chest Pain    HPI Noah Avery is a 47 y.o. male with below of chronic medical conditions presents to the emergency department with intermittent brief lasting few seconds of left-sided chest pain which patient states has been happening for years. Patient denies any concomitant symptoms. Patient denies any leg pain. Swelling no familial history of myocardial infarction   Past Medical History:  Diagnosis Date  . Depression   . H/O degenerative disc disease     Patient Active Problem List   Diagnosis Date Noted  . Major depressive disorder, recurrent severe without psychotic features (HCC) 01/04/2016  . Tobacco use disorder 01/04/2016  . CONSTIPATION 04/22/2010  . ABDOMINAL PAIN 04/22/2010  . LUMBAR RADICULOPATHY 08/05/2009  . HYPERLIPIDEMIA 10/25/2008  . DEPRESSION 10/25/2008  . MIGRAINE, COMMON 10/25/2008  . HYPERTENSION 10/25/2008  . HEMORRHOIDS 10/25/2008  . DENTAL CARIES 10/25/2008  . BLOOD IN STOOL 10/25/2008  . BACK PAIN 10/25/2008  . CARDIAC MURMUR 10/25/2008    Past Surgical History:  Procedure Laterality Date  . BACK SURGERY      Prior to Admission medications   Medication Sig Start Date End Date Taking? Authorizing Provider  cyclobenzaprine (FLEXERIL) 10 MG tablet Take 1 tablet (10 mg total) by mouth 3 (three) times daily as needed. Patient not taking: Reported on 02/23/2018 12/29/17   Darci CurrentBrown, [place] N, MD  mirtazapine (REMERON) 15 MG tablet Take 1 tablet (15 mg total) by mouth at bedtime. Patient not taking: Reported on 02/23/2018 01/06/16   Shari ProwsPucilowska, Jolanta B, MD    Allergies Silver nitrate  No family history on file.  Social History Social History   Tobacco Use  . Smoking status: Current Every Day Smoker   Packs/day: 1.00  . Smokeless tobacco: Never Used  Substance Use Topics  . Alcohol use: Yes    Comment: occasional  . Drug use: No    Review of Systems Constitutional: No fever/chills Eyes: No visual changes. ENT: No sore throat. Cardiovascular: positive for chest pain. Respiratory: Denies shortness of breath. Gastrointestinal: No abdominal pain.  No nausea, no vomiting.  No diarrhea.  No constipation. Genitourinary: Negative for dysuria. Musculoskeletal: Negative for neck pain.  Negative for back pain. Integumentary: Negative for rash. Neurological: Negative for headaches, focal weakness or numbness.   ____________________________________________   PHYSICAL EXAM:  VITAL SIGNS: ED Triage Vitals [03/10/18 0631]  Enc Vitals Group     BP (!) 151/105     Pulse Rate 73     Resp 16     Temp      Temp src      SpO2 99 %     Weight      Height      Head Circumference      Peak Flow      Pain Score 0     Pain Loc      Pain Edu?      Excl. in GC?     Constitutional: Alert and oriented. Well appearing and in no acute distress. Eyes: Conjunctivae are normal.  Head: Atraumatic. Mouth/Throat: Mucous membranes are moist.  Oropharynx non-erythematous. Neck: No stridor.   Cardiovascular: Normal rate, regular rhythm. Good peripheral circulation. Grossly normal heart sounds. Respiratory: Normal respiratory effort.  No retractions. Lungs CTAB. Gastrointestinal: Soft  and nontender. No distention.  Musculoskeletal: No lower extremity tenderness nor edema. No gross deformities of extremities. Neurologic:  Normal speech and language. No gross focal neurologic deficits are appreciated.  Skin:  Skin is warm, dry and intact. No rash noted. Psychiatric: Mood and affect are normal. Speech and behavior are normal.  ____________________________________________   LABS (all labs ordered are listed, but only abnormal results are displayed)  Labs Reviewed  TROPONIN I    ____________________________________________  EKG  ED ECG REPORT I, Clawson N BROWN, the attending physician, personally viewed and interpreted this ECG.   Date: 03/10/2018  EKG Time: 3:08 AM  Rate: 86  Rhythm:Normal sinus rhythm  Axis: normal  Intervals:normal  ST&T Change: none  ____________________________________________  RADIOLOGY I, Robinson N BROWN, personally viewed and evaluated these images (plain radiographs) as part of my medical decision making, as well as reviewing the written report by the radiologist.   CLINICAL DATA:  Intermittent chest pain for 15 years.  EXAM: CHEST - 2 VIEW  COMPARISON:  02/25/2018  FINDINGS: Chronic and unchanged hyperinflation.The cardiomediastinal contours are normal. The lungs are clear. Pulmonary vasculature is normal. No consolidation, pleural effusion, or pneumothorax. No acute osseous abnormalities are seen.  IMPRESSION: Chronic hyperinflation without acute chest finding.   Electronically Signed   By: Narda Rutherford M.D.   On: 03/10/2018 07:52  Official radiology report(s): No results found.    Procedures   ____________________________________________   INITIAL IMPRESSION / ASSESSMENT AND PLAN / ED COURSE  As part of my medical decision making, I reviewed the following data within the electronic MEDICAL RECORD NUMBER   47 year old male presenting with above stated history of physical exam secondary to chest discomfort. Patient has no chest pain at present. EKG revealed no evidence of ischemia or infarction. Laboratory data unremarkable including troponin.  Patient chest pain-free at this time and as such we will discharge patient home with outpatient follow-up.  ____________________________________________  FINAL CLINICAL IMPRESSION(S) / ED DIAGNOSES  Final diagnoses:  Chest pain, unspecified type     MEDICATIONS GIVEN DURING THIS VISIT:  Medications - No data to display   ED Discharge Orders     None       Note:  This document was prepared using Dragon voice recognition software and may include unintentional dictation errors.    Darci Current, MD 03/20/18 [phone]

## 2018-03-10 NOTE — ED Notes (Addendum)
Sherilyn CooterHenry RN called lab to obtain the results of the lab work that was submitted during down time. Lab states that the results are partially in and they are going to run the troponin.

## 2018-03-24 ENCOUNTER — Other Ambulatory Visit: Payer: Self-pay

## 2018-03-24 ENCOUNTER — Emergency Department: Payer: Self-pay

## 2018-03-24 ENCOUNTER — Encounter: Payer: Self-pay | Admitting: Emergency Medicine

## 2018-03-24 DIAGNOSIS — R0789 Other chest pain: Secondary | ICD-10-CM | POA: Insufficient documentation

## 2018-03-24 DIAGNOSIS — Z5321 Procedure and treatment not carried out due to patient leaving prior to being seen by health care provider: Secondary | ICD-10-CM | POA: Insufficient documentation

## 2018-03-24 LAB — CBC
HCT: 39.9 % — ABNORMAL LOW (ref 40.0–52.0)
Hemoglobin: 14.1 g/dL (ref 13.0–18.0)
MCH: 31.7 pg (ref 26.0–34.0)
MCHC: 35.3 g/dL (ref 32.0–36.0)
MCV: 90 fL (ref 80.0–100.0)
Platelets: 244 10*3/uL (ref 150–440)
RBC: 4.43 MIL/uL (ref 4.40–5.90)
RDW: 13.2 % (ref 11.5–14.5)
WBC: 10.9 10*3/uL — AB (ref 3.8–10.6)

## 2018-03-24 NOTE — ED Triage Notes (Signed)
Pt presents to ED with intermittent chest tightness. Pt states this has been ongoing for 15 years but worsened the past couple of weeks. Pt states he does not have a pcp. Pt states he has "been having heart issues but cant explain it". Pt currently has no increased work of breathing or acute distress noted.

## 2018-03-25 ENCOUNTER — Emergency Department
Admission: EM | Admit: 2018-03-25 | Discharge: 2018-03-25 | Discharge status: Against Medical Advice | Payer: Self-pay | Attending: Emergency Medicine | Admitting: Emergency Medicine

## 2018-03-25 LAB — BASIC METABOLIC PANEL WITH GFR
Anion gap: 8 (ref 5–15)
BUN: <5 mg/dL — ABNORMAL LOW (ref 6–20)
CO2: 25 mmol/L (ref 22–32)
Calcium: 8.8 mg/dL — ABNORMAL LOW (ref 8.9–10.3)
Chloride: 104 mmol/L (ref 98–111)
Creatinine, Ser: 0.97 mg/dL (ref 0.61–1.24)
GFR calc Af Amer: >60 mL/min
GFR calc non Af Amer: >60 mL/min
Glucose, Bld: 132 mg/dL — ABNORMAL HIGH (ref 70–99)
Potassium: 3 mmol/L — ABNORMAL LOW (ref 3.5–5.1)
Sodium: 137 mmol/L (ref 135–145)

## 2018-03-25 LAB — TROPONIN I: Troponin I: <0.03 ng/mL

## 2018-03-25 NOTE — ED Notes (Signed)
Pt called from lobby to re-draw troponin level. Unable to locate pt. Pt did not announce leaving to this nurse or staff in the lobby.

## 2018-04-04 ENCOUNTER — Telehealth: Payer: Self-pay | Admitting: Emergency Medicine

## 2018-04-04 NOTE — Telephone Encounter (Signed)
Called patient due to lwot to inquire about condition and follow up plans. Left message.   

## 2018-04-12 ENCOUNTER — Ambulatory Visit: Payer: Self-pay | Admitting: Cardiovascular Disease

## 2018-04-14 ENCOUNTER — Encounter: Payer: Self-pay | Admitting: Cardiovascular Disease

## 2019-03-20 IMAGING — CR DG CHEST 2V
2 series · 2 of 2 positions shown · non-contrast
Comparison: Chest radiograph dated 03/10/2018

CLINICAL DATA: 47-year-old male with chest pain.

EXAM:
CHEST - 2 VIEW

[chest pa]
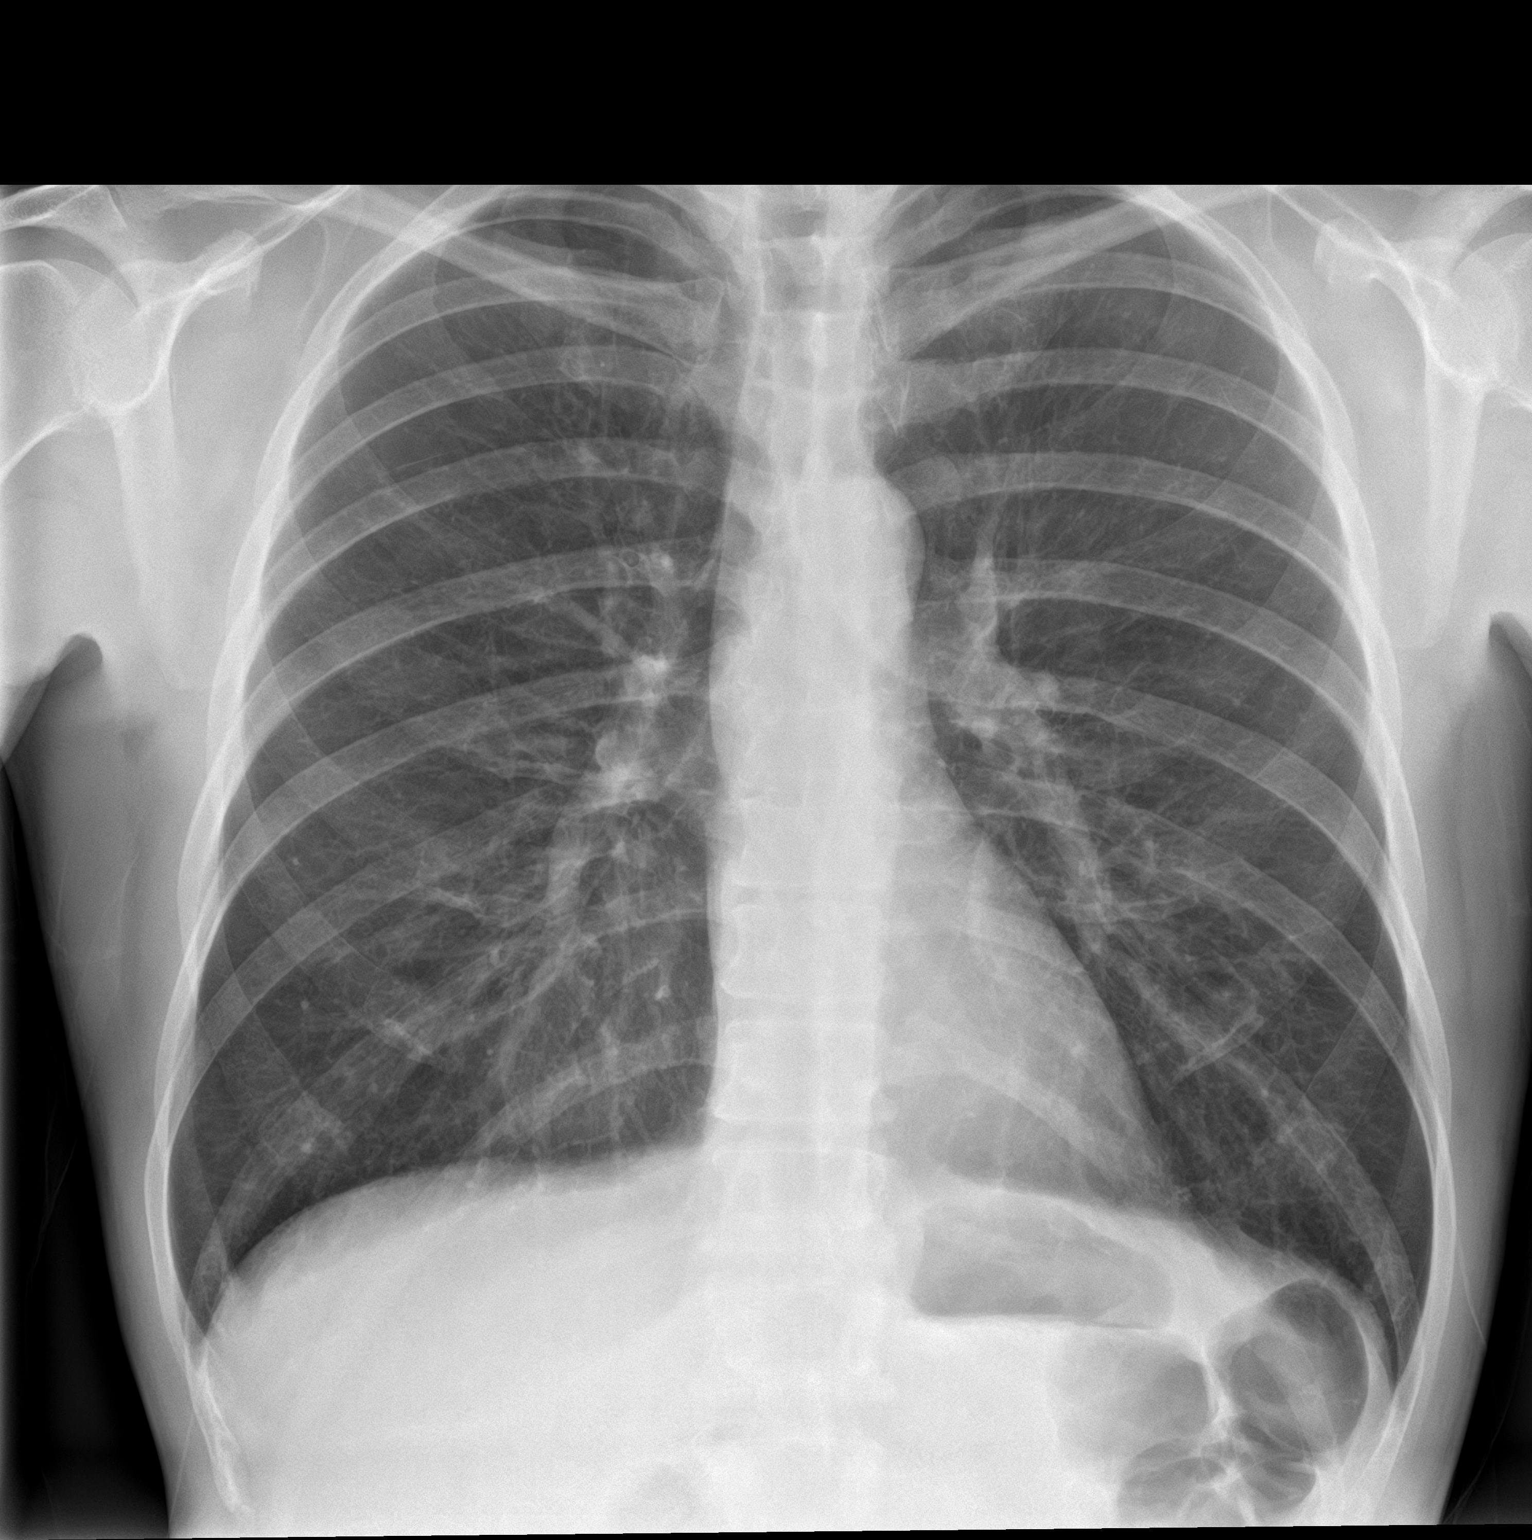

[chest lat]
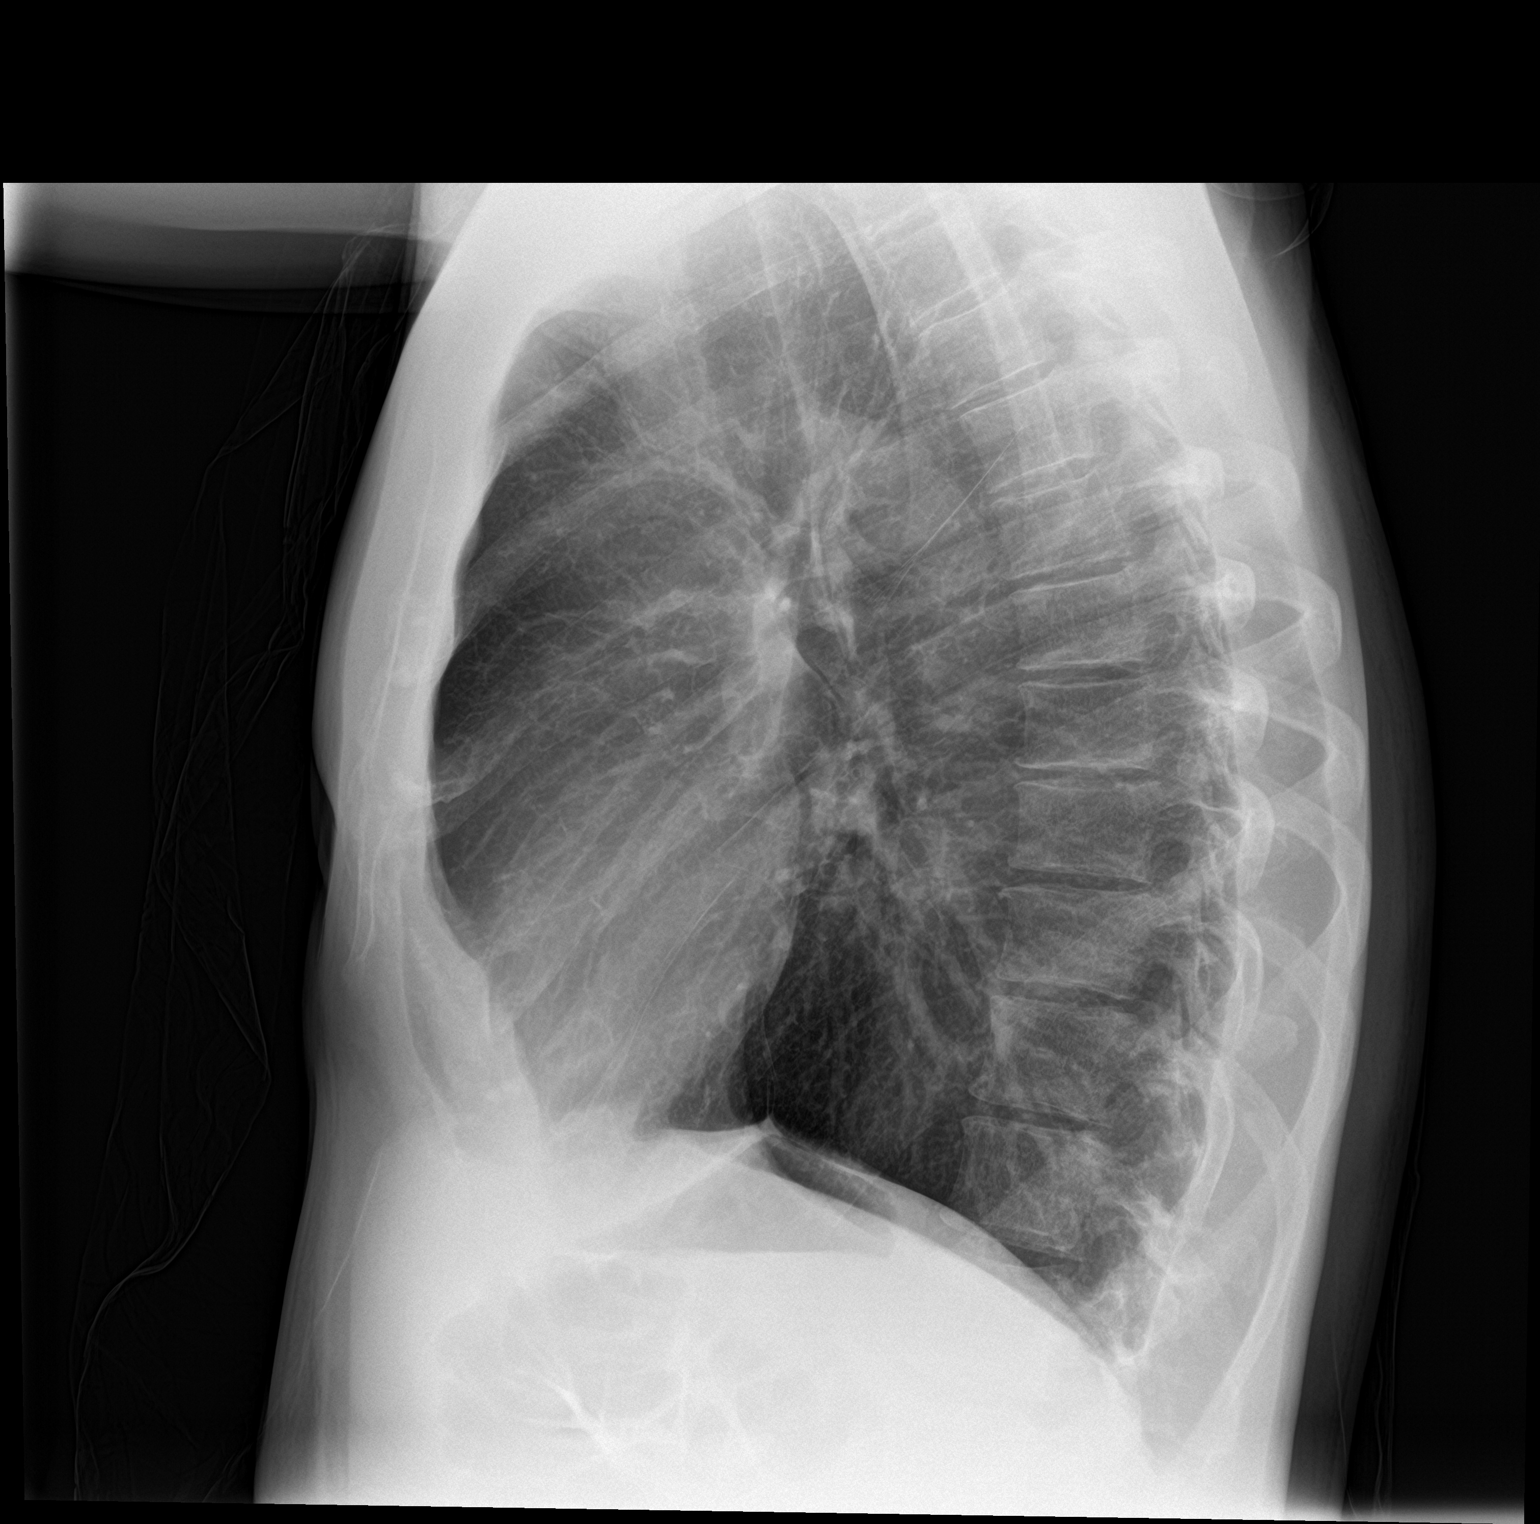

[2 of 2 positions shown; findings below may reference images not displayed]

FINDINGS: The heart size and mediastinal contours are within normal limits.
Both lungs are clear. The visualized skeletal structures are
unremarkable.
IMPRESSION: No active cardiopulmonary disease.

## 2021-01-11 ENCOUNTER — Other Ambulatory Visit: Payer: Self-pay

## 2021-01-11 ENCOUNTER — Emergency Department
Admission: EM | Admit: 2021-01-11 | Discharge: 2021-01-11 | Disposition: A | Discharge status: Home / Self Care | Payer: Self-pay | Attending: Emergency Medicine | Admitting: Emergency Medicine

## 2021-01-11 ENCOUNTER — Encounter: Payer: Self-pay | Admitting: Emergency Medicine

## 2021-01-11 ENCOUNTER — Emergency Department: Payer: Self-pay

## 2021-01-11 DIAGNOSIS — Y9289 Other specified places as the place of occurrence of the external cause: Secondary | ICD-10-CM | POA: Insufficient documentation

## 2021-01-11 DIAGNOSIS — I10 Essential (primary) hypertension: Secondary | ICD-10-CM | POA: Insufficient documentation

## 2021-01-11 DIAGNOSIS — F17210 Nicotine dependence, cigarettes, uncomplicated: Secondary | ICD-10-CM | POA: Insufficient documentation

## 2021-01-11 DIAGNOSIS — W230XXA Caught, crushed, jammed, or pinched between moving objects, initial encounter: Secondary | ICD-10-CM | POA: Insufficient documentation

## 2021-01-11 DIAGNOSIS — S60011A Contusion of right thumb without damage to nail, initial encounter: Secondary | ICD-10-CM | POA: Insufficient documentation

## 2021-01-11 DIAGNOSIS — Y990 Civilian activity done for income or pay: Secondary | ICD-10-CM | POA: Insufficient documentation

## 2021-01-11 DIAGNOSIS — Y9389 Activity, other specified: Secondary | ICD-10-CM | POA: Insufficient documentation

## 2021-01-11 DIAGNOSIS — Y92810 Car as the place of occurrence of the external cause: Secondary | ICD-10-CM | POA: Insufficient documentation

## 2021-01-11 NOTE — ED Notes (Addendum)
RN came in to discharge pt and he had left without instructions. Unaware if he seen EDP prior to leaving to discuss results of XRAY

## 2021-01-11 NOTE — ED Provider Notes (Signed)
[facility] Emergency Department Provider Note   ____________________________________________   Event Date/Time   First MD Initiated Contact with Patient 01/11/21 1226     (approximate)  I have reviewed the triage vital signs and the nursing notes.   HISTORY  Chief Complaint Hand Injury    HPI Noah Avery is a 50 y.o. male patient presents with right thumb pain secondary to contusion by car door.  Patient denies loss sensation or loss of function.  Rates pain 7/10.  Described pain as "achy".  No palliative measure prior to arrival.  Patient is right-hand dominant.         Past Medical History:  Diagnosis Date   Depression    H/O degenerative disc disease     Patient Active Problem List   Diagnosis Date Noted   Major depressive disorder, recurrent severe without psychotic features (HCC) 01/04/2016   Tobacco use disorder 01/04/2016   CONSTIPATION 04/22/2010   ABDOMINAL PAIN 04/22/2010   LUMBAR RADICULOPATHY 08/05/2009   HYPERLIPIDEMIA 10/25/2008   DEPRESSION 10/25/2008   MIGRAINE, COMMON 10/25/2008   HYPERTENSION 10/25/2008   HEMORRHOIDS 10/25/2008   DENTAL CARIES 10/25/2008   BLOOD IN STOOL 10/25/2008   BACK PAIN 10/25/2008   CARDIAC MURMUR 10/25/2008    Past Surgical History:  Procedure Laterality Date   BACK SURGERY      Prior to Admission medications   Medication Sig Start Date End Date Taking? Authorizing Provider  cyclobenzaprine (FLEXERIL) 10 MG tablet Take 1 tablet (10 mg total) by mouth 3 (three) times daily as needed. Patient not taking: Reported on 02/23/2018 12/29/17   Darci Current, MD  mirtazapine (REMERON) 15 MG tablet Take 1 tablet (15 mg total) by mouth at bedtime. Patient not taking: Reported on 02/23/2018 01/06/16   Shari Prows, MD    Allergies Silver nitrate  No family history on file.  Social History Social History   Tobacco Use   Smoking status: Every Day    Packs/day: 1.00    Types:  Cigarettes   Smokeless tobacco: Never  Vaping Use   Vaping Use: Never used  Substance Use Topics   Alcohol use: Yes    Comment: occasional   Drug use: No    Review of Systems  Constitutional: No fever/chills Eyes: No visual changes. ENT: No sore throat. Cardiovascular: Denies chest pain. Respiratory: Denies shortness of breath. Gastrointestinal: No abdominal pain.  No nausea, no vomiting.  No diarrhea.  No constipation. Genitourinary: Negative for dysuria. Musculoskeletal: Right thumb pain.  Positive for back pain. Skin: Negative for rash. Neurological: Negative for headaches, focal weakness or numbness. Psychiatric: Depression Allergic/Immunilogical: Silver nitrate ____________________________________________   PHYSICAL EXAM:  VITAL SIGNS: ED Triage Vitals  Enc Vitals Group     BP 01/11/21 1212 (!) 131/91     Pulse Rate 01/11/21 1212 (!) 102     Resp 01/11/21 1212 17     Temp 01/11/21 1212 98.6 F (37 C)     Temp Source 01/11/21 1212 Oral     SpO2 01/11/21 1212 97 %     Weight 01/11/21 1204 170 lb (77.1 kg)     Height 01/11/21 1204 5\' 10"  (1.778 m)     Head Circumference --      Peak Flow --      Pain Score 01/11/21 1204 7     Pain Loc --      Pain Edu? --      Excl. in GC? --  Constitutional: Alert and oriented. Well appearing and in no acute distress. Cardiovascular: Normal rate, regular rhythm. Grossly normal heart sounds.  Good peripheral circulation. Respiratory: Normal respiratory effort.  No retractions. Lungs CTAB. Musculoskeletal: No obvious deformity to the right thumb.  Small subungual hematoma. Neurologic:  Normal speech and language. No gross focal neurologic deficits are appreciated. No gait instability. Skin:  Skin is warm, dry and intact. No rash noted. Psychiatric: Mood and affect are normal. Speech and behavior are normal.  ____________________________________________   LABS (all labs ordered are listed, but only abnormal results are  displayed)  Labs Reviewed - No data to display ____________________________________________  EKG   ____________________________________________  RADIOLOGY I, Joni Reining, personally viewed and evaluated these images (plain radiographs) as part of my medical decision making, as well as reviewing the written report by the radiologist.  ED MD interpretation: No fracture on x-ray of the right thumb.  Official radiology report(s): DG Finger Thumb Right  Result Date: 01/11/2021 CLINICAL DATA:  Contusion. Car door injury. Redness at the distal end of the thumb. EXAM: RIGHT THUMB 2+V COMPARISON:  None. FINDINGS: There is soft tissue swelling of the distal aspect of the thumb. There is no acute fracture. Focal radiopaque debris overlies the soft tissues of the distal thumb. No soft tissue gas. IMPRESSION: No acute fracture. Soft tissue swelling and probable debris of the distal thumb. Electronically Signed   By: Norva Pavlov M.D.   On: 01/11/2021 12:58    ____________________________________________   PROCEDURES  Procedure(s) performed (including Critical Care):  Procedures   ____________________________________________   INITIAL IMPRESSION / ASSESSMENT AND PLAN / ED COURSE  As part of my medical decision making, I reviewed the following data within the electronic MEDICAL RECORD NUMBER         Patient presents with pain secondary to contusion by car door earlier today.  Discussed x-ray findings which are negative for fracture.  Patient given discharge care instructions and advised him to ED if condition worsens.      ____________________________________________   FINAL CLINICAL IMPRESSION(S) / ED DIAGNOSES  Final diagnoses:  Contusion of right thumb without damage to nail, initial encounter     ED Discharge Orders     None        Note:  This document was prepared using Dragon voice recognition software and may include unintentional dictation errors.     Joni Reining, PA-C 01/11/21 1317    Delton [place], MD 01/11/21 [phone]

## 2021-01-11 NOTE — ED Notes (Signed)
Pt slammed thumb in car door this morning. He wanted to make sure it isnt fractured in his thumb. Pt did go into work today and worked his full shift before coming here.

## 2021-01-11 NOTE — Discharge Instructions (Addendum)
No fracture on x-ray of the right thumb.  You have a small subungual hematoma which do not need to be drained.  Read and follow discharge care instruction advised over-the-counter anti-inflammatory medications.

## 2021-02-24 ENCOUNTER — Emergency Department: Payer: Self-pay

## 2021-02-24 ENCOUNTER — Other Ambulatory Visit: Payer: Self-pay

## 2021-02-24 ENCOUNTER — Emergency Department
Admission: EM | Admit: 2021-02-24 | Discharge: 2021-02-24 | Disposition: A | Discharge status: Home / Self Care | Payer: Self-pay | Attending: Student in an Organized Health Care Education/Training Program | Admitting: Student in an Organized Health Care Education/Training Program

## 2021-02-24 DIAGNOSIS — S93509A Unspecified sprain of unspecified toe(s), initial encounter: Secondary | ICD-10-CM

## 2021-02-24 DIAGNOSIS — X58XXXA Exposure to other specified factors, initial encounter: Secondary | ICD-10-CM | POA: Insufficient documentation

## 2021-02-24 DIAGNOSIS — F17210 Nicotine dependence, cigarettes, uncomplicated: Secondary | ICD-10-CM | POA: Insufficient documentation

## 2021-02-24 DIAGNOSIS — I10 Essential (primary) hypertension: Secondary | ICD-10-CM | POA: Insufficient documentation

## 2021-02-24 DIAGNOSIS — M79672 Pain in left foot: Secondary | ICD-10-CM

## 2021-02-24 DIAGNOSIS — S93602A Unspecified sprain of left foot, initial encounter: Secondary | ICD-10-CM | POA: Insufficient documentation

## 2021-02-24 DIAGNOSIS — Y990 Civilian activity done for income or pay: Secondary | ICD-10-CM | POA: Insufficient documentation

## 2021-02-24 NOTE — ED Triage Notes (Addendum)
Pt come with c/o left foot pain. Pt states no sure if from old injury. Pt states sharp pain that just started.

## 2021-02-24 NOTE — ED Notes (Signed)
See triage note  Presents with left foot pain  Denies any recent injury  Good pulses

## 2021-02-24 NOTE — Discharge Instructions (Addendum)
Your exam and x-ray are normal and reassuring at this time.  You should follow-up with podiatry for ongoing management of your foot sprain.  Take over-the-counter Tylenol or Motrin as needed for pain.  Return to the ED if needed.

## 2021-03-01 NOTE — ED Provider Notes (Signed)
[facility] Emergency Department Provider Note ____________________________________________  Time seen: 1125  I have reviewed the triage vital signs and the nursing notes.  HISTORY  Chief Complaint  Foot Pain   HPI Noah Avery is a 50 y.o. male presents to the ED with complaint of left foot pain at the first MTP, after he sustained injury at work.  Patient reports being in a squatting position up on his toes, when he felt a sharp pain past to his great toe.  Of note is that he has a remote injury to that left great toe of a closed fracture that heal without surgical intervention.  He denies any interim complaints or injury.  He presents with some ongoing pain to the left great toe.   Past Medical History:  Diagnosis Date   Depression    H/O degenerative disc disease     Patient Active Problem List   Diagnosis Date Noted   Major depressive disorder, recurrent severe without psychotic features (HCC) 01/04/2016   Tobacco use disorder 01/04/2016   CONSTIPATION 04/22/2010   ABDOMINAL PAIN 04/22/2010   LUMBAR RADICULOPATHY 08/05/2009   HYPERLIPIDEMIA 10/25/2008   DEPRESSION 10/25/2008   MIGRAINE, COMMON 10/25/2008   HYPERTENSION 10/25/2008   HEMORRHOIDS 10/25/2008   DENTAL CARIES 10/25/2008   BLOOD IN STOOL 10/25/2008   BACK PAIN 10/25/2008   CARDIAC MURMUR 10/25/2008    Past Surgical History:  Procedure Laterality Date   BACK SURGERY      Prior to Admission medications   Not on File    Allergies Silver nitrate  History reviewed. No pertinent family history.  Social History Social History   Tobacco Use   Smoking status: Every Day    Packs/day: 1.00    Types: Cigarettes   Smokeless tobacco: Never  Vaping Use   Vaping Use: Never used  Substance Use Topics   Alcohol use: Yes    Comment: occasional   Drug use: No    Review of Systems  Constitutional: Negative for fever. Cardiovascular: Negative for chest pain. Respiratory:  Negative for shortness of breath. Gastrointestinal: Negative for abdominal pain, vomiting and diarrhea. Genitourinary: Negative for dysuria. Musculoskeletal: Negative for back pain.  Left great toe pain as above. Skin: Negative for rash. Neurological: Negative for headaches, focal weakness or numbness. ____________________________________________  PHYSICAL EXAM:  VITAL SIGNS: ED Triage Vitals  Enc Vitals Group     BP 02/24/21 1054 (!) 133/99     Pulse Rate 02/24/21 1054 95     Resp 02/24/21 1054 18     Temp 02/24/21 1054 98 F (36.7 C)     Temp src --      SpO2 02/24/21 1054 95 %     Weight 02/24/21 1054 160 lb (72.6 kg)     Height 02/24/21 1054 5\' 10"  (1.778 m)     Head Circumference --      Peak Flow --      Pain Score 02/24/21 1050 7     Pain Loc --      Pain Edu? --      Excl. in GC? --     Constitutional: Alert and oriented. Well appearing and in no distress. Head: Normocephalic and atraumatic. Eyes: Conjunctivae are normal. Normal extraocular movements Cardiovascular: Normal rate, regular rhythm. Normal distal pulses. Respiratory: Normal respiratory effort.  Musculoskeletal: Left foot without obvious deformity, dislocation, or joint effusions.  No obvious deformity noted to the first MTP.  Patient with normal active range of motion of the  toes with flexion extension range ankle exam is benign.  Nontender with normal range of motion in all extremities.  Neurologic: Cranial nerves II to XII grossly intact.  Normal gait without ataxia. Normal speech and language. No gross focal neurologic deficits are appreciated. Skin:  Skin is warm, dry and intact. No rash noted.  No erythema, induration, or ecchymosis is noted at the left first MTP. Psychiatric: Mood and affect are normal. Patient exhibits appropriate insight and judgment. ____________________________________________    {LABS (pertinent  positives/negatives)  ____________________________________________  {EKG  ____________________________________________   RADIOLOGY Official radiology report(s):   DG Left Foot  IMPRESSION: 1. No acute bony abnormality. 2. Degenerative changes in the MTP joint of the great toe. ____________________________________________  PROCEDURES   Procedures ____________________________________________   INITIAL IMPRESSION / ASSESSMENT AND PLAN / ED COURSE  As part of my medical decision making, I reviewed the following data within the electronic MEDICAL RECORD NUMBER Radiograph reviewed WNL and Notes from prior ED visits    Patient ED evaluation of acute injury to the left great toe following mechanical strain.  Patient presents with concern given his history of previous underlying fracture.  Exam is overall benign reassuring at this time but no radiologic evidence of any acute fracture or dislocation.  Patient with a reassuring exam will be referred to podiatry for further evaluation of his toe sprain.  Noah Avery was evaluated in Emergency Department on 03/01/2021 for the symptoms described in the history of present illness. He was evaluated in the context of the global COVID-19 pandemic, which necessitated consideration that the patient might be at risk for infection with the SARS-CoV-2 virus that causes COVID-19. Institutional protocols and algorithms that pertain to the evaluation of patients at risk for COVID-19 are in a state of rapid change based on information released by regulatory bodies including the CDC and federal and state organizations. These policies and algorithms were followed during the patient's care in the ED. ____________________________________________  FINAL CLINICAL IMPRESSION(S) / ED DIAGNOSES  Final diagnoses:  Foot pain, left  Toe sprain, initial encounter      Lissa Hoard, PA-C 03/01/21 0756    Willy Eddy, MD 03/01/21 1400

## 2021-06-07 ENCOUNTER — Other Ambulatory Visit: Payer: Self-pay

## 2021-06-07 ENCOUNTER — Ambulatory Visit
Admission: EM | Admit: 2021-06-07 | Discharge: 2021-06-07 | Disposition: A | Discharge status: Home / Self Care | Payer: Medicaid Other | Attending: Family Medicine | Admitting: Family Medicine

## 2021-06-07 DIAGNOSIS — J111 Influenza due to unidentified influenza virus with other respiratory manifestations: Secondary | ICD-10-CM | POA: Insufficient documentation

## 2021-06-07 DIAGNOSIS — Z20822 Contact with and (suspected) exposure to covid-19: Secondary | ICD-10-CM | POA: Insufficient documentation

## 2021-06-07 LAB — RESP PANEL BY RT-PCR (FLU A&B, COVID) ARPGX2
Influenza A by PCR: POSITIVE — AB
Influenza B by PCR: NEGATIVE
SARS Coronavirus 2 by RT PCR: NEGATIVE

## 2021-06-07 NOTE — Discharge Instructions (Signed)
Rest. Fluids.  Continue OTC meds.  Results will be back soon.  Take care  Dr. Adriana Simas

## 2021-06-07 NOTE — ED Triage Notes (Signed)
Pt c/o fever, cough, chills, weakness x 1 week. Pt's roommate was dx with Flu yesterday.

## 2021-06-07 NOTE — ED Provider Notes (Addendum)
[facility]    CSN: [id number] Arrival date & time: 06/07/21  1259      History   Chief Complaint Chief Complaint  Patient presents with   Cough   Fever   Fatigue    HPI 50 year old male presents for evaluation of the above.  Patient states that his symptoms started on Tuesday to Wednesday.  He has had fever, chills, cough, and postnasal drip.  Fever has now subsided.  He also reports body aches.  He has remained is positive for influenza.  He believes that he has influenza.  He is currently bothered by the cough and postnasal drip.  The other symptoms have improved.  Desires testing today.  Past Medical History:  Diagnosis Date   Depression    H/O degenerative disc disease     Patient Active Problem List   Diagnosis Date Noted   Major depressive disorder, recurrent severe without psychotic features (HCC) 01/04/2016   Tobacco use disorder 01/04/2016   CONSTIPATION 04/22/2010   ABDOMINAL PAIN 04/22/2010   LUMBAR RADICULOPATHY 08/05/2009   HYPERLIPIDEMIA 10/25/2008   DEPRESSION 10/25/2008   MIGRAINE, COMMON 10/25/2008   HYPERTENSION 10/25/2008   HEMORRHOIDS 10/25/2008   DENTAL CARIES 10/25/2008   BLOOD IN STOOL 10/25/2008   BACK PAIN 10/25/2008   CARDIAC MURMUR 10/25/2008    Past Surgical History:  Procedure Laterality Date   BACK SURGERY         Home Medications    Prior to Admission medications   Not on File    Family History No family history on file.  Social History Social History   Tobacco Use   Smoking status: Every Day    Packs/day: 1.00    Types: Cigarettes   Smokeless tobacco: Never  Vaping Use   Vaping Use: Never used  Substance Use Topics   Alcohol use: Yes    Comment: occasional   Drug use: No     Allergies   Silver nitrate   Review of Systems Review of Systems  Constitutional:  Positive for fever.  HENT:  Positive for postnasal drip.   Respiratory:  Positive for cough.     Physical Exam Triage Vital  Signs ED Triage Vitals  Enc Vitals Group     BP 06/07/21 1319 (!) 129/102     Pulse Rate 06/07/21 1319 (!) 101     Resp 06/07/21 1319 16     Temp 06/07/21 1319 99 F (37.2 C)     Temp Source 06/07/21 1319 Oral     SpO2 06/07/21 1319 99 %     Weight 06/07/21 1318 165 lb (74.8 kg)     Height 06/07/21 1318 5\' 10"  (1.778 m)     Head Circumference --      Peak Flow --      Pain Score 06/07/21 1317 7     Pain Loc --      Pain Edu? --      Excl. in GC? --     Updated Vital Signs BP (!) 129/102 (BP Location: Left Arm)   Pulse (!) 101   Temp 99 F (37.2 C) (Oral)   Resp 16   Ht 5\' 10"  (1.778 m)   Wt 74.8 kg   SpO2 99%   BMI 23.68 kg/m   Visual Acuity Right Eye Distance:   Left Eye Distance:   Bilateral Distance:    Right Eye Near:   Left Eye Near:    Bilateral Near:     Physical Exam Vitals  and nursing note reviewed.  Constitutional:      General: He is not in acute distress.    Appearance: Normal appearance. He is not ill-appearing.  HENT:     Head: Normocephalic and atraumatic.  Eyes:     General:        Right eye: No discharge.        Left eye: No discharge.     Conjunctiva/sclera: Conjunctivae normal.  Cardiovascular:     Rate and Rhythm: Regular rhythm. Tachycardia present.  Pulmonary:     Effort: Pulmonary effort is normal.     Breath sounds: Normal breath sounds. No wheezing, rhonchi or rales.  Neurological:     Mental Status: He is alert.  Psychiatric:        Mood and Affect: Mood normal.        Behavior: Behavior normal.     UC Treatments / Results  Labs (all labs ordered are listed, but only abnormal results are displayed) Labs Reviewed  RESP PANEL BY RT-PCR (FLU A&B, COVID) ARPGX2 - Abnormal; Notable for the following components:      Result Value   Influenza A by PCR POSITIVE (*)    All other components within normal limits    EKG   Radiology No results found.  Procedures Procedures (including critical care time)  Medications  Ordered in UC Medications - No data to display  Initial Impression / Assessment and Plan / UC Course  I have reviewed the triage vital signs and the nursing notes.  Pertinent labs & imaging results that were available during my care of the patient were reviewed by me and considered in my medical decision making (see chart for details).    50 year old male presents with influenza.  He is out of the treatment window.  Advised supportive care.  May return to work on Monday.  Final Clinical Impressions(s) / UC Diagnoses   Final diagnoses:  Influenza     Discharge Instructions      Rest. Fluids.  Continue OTC meds.  Results will be back soon.  Take care  Dr. Adriana Simas     ED Prescriptions   None    PDMP not reviewed this encounter.   Tommie Sams, DO 06/07/21 1441    Tommie Sams, DO 06/07/21 [phone]

## 2022-02-11 ENCOUNTER — Observation Stay
Admission: EM | Admit: 2022-02-11 | Discharge: 2022-02-12 | Disposition: A | Discharge status: Home / Self Care | Payer: Self-pay | Attending: Internal Medicine | Admitting: Internal Medicine

## 2022-02-11 ENCOUNTER — Emergency Department: Payer: Self-pay

## 2022-02-11 ENCOUNTER — Other Ambulatory Visit: Payer: Self-pay

## 2022-02-11 DIAGNOSIS — N179 Acute kidney failure, unspecified: Secondary | ICD-10-CM | POA: Insufficient documentation

## 2022-02-11 DIAGNOSIS — F17210 Nicotine dependence, cigarettes, uncomplicated: Secondary | ICD-10-CM | POA: Insufficient documentation

## 2022-02-11 DIAGNOSIS — E871 Hypo-osmolality and hyponatremia: Secondary | ICD-10-CM | POA: Insufficient documentation

## 2022-02-11 DIAGNOSIS — M5136 Other intervertebral disc degeneration, lumbar region: Secondary | ICD-10-CM

## 2022-02-11 DIAGNOSIS — I10 Essential (primary) hypertension: Secondary | ICD-10-CM | POA: Insufficient documentation

## 2022-02-11 DIAGNOSIS — E875 Hyperkalemia: Principal | ICD-10-CM | POA: Insufficient documentation

## 2022-02-11 LAB — CBC
HCT: 44.7 % (ref 39.0–52.0)
Hemoglobin: 15.3 g/dL (ref 13.0–17.0)
MCH: 31.7 pg (ref 26.0–34.0)
MCHC: 34.2 g/dL (ref 30.0–36.0)
MCV: 92.7 fL (ref 80.0–100.0)
Platelets: 290 10*3/uL (ref 150–400)
RBC: 4.82 MIL/uL (ref 4.22–5.81)
RDW: 12.2 % (ref 11.5–15.5)
WBC: 14.6 10*3/uL — ABNORMAL HIGH (ref 4.0–10.5)
nRBC: 0 % (ref 0.0–0.2)

## 2022-02-11 LAB — BASIC METABOLIC PANEL
Anion gap: 10 (ref 5–15)
BUN: 22 mg/dL — ABNORMAL HIGH (ref 6–20)
CO2: 26 mmol/L (ref 22–32)
Calcium: 9 mg/dL (ref 8.9–10.3)
Chloride: 93 mmol/L — ABNORMAL LOW (ref 98–111)
Creatinine, Ser: 1.99 mg/dL — ABNORMAL HIGH (ref 0.61–1.24)
GFR, Estimated: 40 mL/min — ABNORMAL LOW (ref >60)
Glucose, Bld: 91 mg/dL (ref 70–99)
Potassium: 6.6 mmol/L (ref 3.5–5.1)
Sodium: 129 mmol/L — ABNORMAL LOW (ref 135–145)

## 2022-02-11 LAB — TROPONIN I (HIGH SENSITIVITY)
Troponin I (High Sensitivity): 4 ng/L (ref <18)
Troponin I (High Sensitivity): 4 ng/L (ref <18)

## 2022-02-11 LAB — CBG MONITORING, ED: Glucose-Capillary: 58 mg/dL — ABNORMAL LOW (ref 70–99)

## 2022-02-11 MED ORDER — DEXTROSE 50 % IV SOLN: 1.0000 | Freq: Once | INTRAVENOUS | Status: DC

## 2022-02-11 MED ORDER — SODIUM CHLORIDE 0.9 % IV BOLUS
1000.0000 mL | Freq: Once | INTRAVENOUS | Status: AC
Start: 2022-02-11 — End: 2022-02-11
  Administered 2022-02-11: 1000 mL via INTRAVENOUS

## 2022-02-11 MED ORDER — DEXTROSE 50 % IV SOLN
2.0000 | Freq: Once | INTRAVENOUS | Status: AC
  Administered 2022-02-11: 100 mL via INTRAVENOUS
  Filled 2022-02-11: qty 100

## 2022-02-11 MED ORDER — SODIUM ZIRCONIUM CYCLOSILICATE 10 G PO PACK
10.0000 g | PACK | ORAL | Status: AC
  Administered 2022-02-11: 10 g via ORAL
  Filled 2022-02-11: qty 1

## 2022-02-11 MED ORDER — ACETAMINOPHEN 650 MG RE SUPP: 650.0000 mg | Freq: Four times a day (QID) | RECTAL | Status: DC | PRN

## 2022-02-11 MED ORDER — ONDANSETRON HCL 4 MG/2ML IJ SOLN: 4.0000 mg | Freq: Four times a day (QID) | INTRAMUSCULAR | Status: DC | PRN

## 2022-02-11 MED ORDER — SODIUM BICARBONATE 8.4 % IV SOLN
50.0000 meq | Freq: Once | INTRAVENOUS | Status: AC
  Administered 2022-02-11: 50 meq via INTRAVENOUS
  Filled 2022-02-11: qty 50

## 2022-02-11 MED ORDER — CALCIUM GLUCONATE 10 % IV SOLN
1.0000 g | Freq: Once | INTRAVENOUS | Status: AC
  Administered 2022-02-11: 1 g via INTRAVENOUS
  Filled 2022-02-11: qty 10

## 2022-02-11 MED ORDER — FUROSEMIDE 10 MG/ML IJ SOLN
40.0000 mg | Freq: Once | INTRAMUSCULAR | Status: AC
  Administered 2022-02-11: 40 mg via INTRAVENOUS
  Filled 2022-02-11: qty 4

## 2022-02-11 MED ORDER — ONDANSETRON HCL 4 MG PO TABS: 4.0000 mg | ORAL_TABLET | Freq: Four times a day (QID) | ORAL | Status: DC | PRN

## 2022-02-11 MED ORDER — SODIUM CHLORIDE 0.9 % IV BOLUS
500.0000 mL | Freq: Once | INTRAVENOUS | Status: AC
  Administered 2022-02-11: 500 mL via INTRAVENOUS

## 2022-02-11 MED ORDER — ACETAMINOPHEN 325 MG PO TABS: 650.0000 mg | ORAL_TABLET | Freq: Four times a day (QID) | ORAL | Status: DC | PRN

## 2022-02-11 MED ORDER — DEXTROSE 50 % IV SOLN
25.0000 g | Freq: Once | INTRAVENOUS | Status: AC
  Administered 2022-02-11: 25 g via INTRAVENOUS

## 2022-02-11 MED ORDER — ENOXAPARIN SODIUM 40 MG/0.4ML IJ SOSY
40.0000 mg | PREFILLED_SYRINGE | INTRAMUSCULAR | Status: DC
Start: 2022-02-11 — End: 2022-02-12
  Filled 2022-02-11: qty 0.4

## 2022-02-11 MED ORDER — SODIUM CHLORIDE 0.9 % IV SOLN
INTRAVENOUS | Status: DC
Start: 2022-02-11 — End: 2022-02-12

## 2022-02-11 MED ORDER — INSULIN ASPART 100 UNIT/ML IV SOLN
10.0000 [IU] | Freq: Once | INTRAVENOUS | Status: AC
  Administered 2022-02-11: 10 [IU] via INTRAVENOUS
  Filled 2022-02-11: qty 0.1

## 2022-02-11 NOTE — ED Provider Notes (Signed)
[facility] Provider Note    Event Date/Time   First MD Initiated Contact with Patient 02/11/22 2122     (approximate)   History   Chief Complaint: Weakness and Sore Throat   HPI  Noah Avery is a 51 y.o. male with a past history of hypertension who comes the ED with generalized weakness and paresthesia that started earlier today.  Waxing and waning, no aggravating or alleviating factors.  He notes that he recently was sick with cough and congestion and sick contacts and believes he had an upper respiratory infection.  Currently he denies shortness of breath.  Denies palpitations or syncope.  He normally drinks 4 high potassium energy drinks a day.      Physical Exam   Triage Vital Signs: ED Triage Vitals  Enc Vitals Group     BP 02/11/22 1833 (!) 133/93     Pulse Rate 02/11/22 1833 86     Resp 02/11/22 1833 16     Temp 02/11/22 1833 98.3 F (36.8 C)     Temp Source 02/11/22 1833 Oral     SpO2 02/11/22 1833 97 %     Weight 02/11/22 1834 155 lb (70.3 kg)     Height 02/11/22 1834 5\' 10"  (1.778 m)     Head Circumference --      Peak Flow --      Pain Score 02/11/22 1833 4     Pain Loc --      Pain Edu? --      Excl. in GC? --     Most recent vital signs: Vitals:   02/11/22 1833 02/11/22 2122  BP: (!) 133/93 (!) 126/95  Pulse: 86 96  Resp: 16 20  Temp: 98.3 F (36.8 C) 97.9 F (36.6 C)  SpO2: 97% 98%    General: Awake, no distress.  CV:  Good peripheral perfusion.  Regular rate and rhythm Resp:  Normal effort.  Clear to auscultation Abd:  No distention.  Soft nontender Other:  Dry mucous membranes.  No edema, no rash   ED Results / Procedures / Treatments   Labs (all labs ordered are listed, but only abnormal results are displayed) Labs Reviewed  BASIC METABOLIC PANEL - Abnormal; Notable for the following components:      Result Value   Sodium 129 (*)    Potassium 6.6 (*)    Chloride 93 (*)    BUN 22 (*)    Creatinine,  Ser 1.99 (*)    GFR, Estimated 40 (*)    All other components within normal limits  CBC - Abnormal; Notable for the following components:   WBC 14.6 (*)    All other components within normal limits  TROPONIN I (HIGH SENSITIVITY)  TROPONIN I (HIGH SENSITIVITY)     EKG Interpreted by me Normal sinus rhythm, rate of 94.  Normal axis and intervals.  Poor R wave progression.  Peaked T waves anteriorly.  Normal ST segments.  No ischemic changes.   RADIOLOGY Chest x-ray interpreted by me, appears normal without edema or effusion.  No consolidation.  Radiology report reviewed.   PROCEDURES:  .1-3 Lead EKG Interpretation  Performed by: [id number], MD Authorized by: Sharman Cheek, MD     Interpretation: normal     ECG rate:  95   ECG rate assessment: normal     Rhythm: sinus rhythm     Ectopy: none     Conduction: normal   Comments:        .  Critical Care  Performed by: Sharman Cheek, MD Authorized by: Sharman Cheek, MD   Critical care provider statement:    Critical care time (minutes):  35   Critical care time was exclusive of:  Separately billable procedures and treating other patients   Critical care was necessary to treat or prevent imminent or life-threatening deterioration of the following conditions:  Metabolic crisis, renal failure and cardiac failure   Critical care was time spent personally by me on the following activities:  Development of treatment plan with patient or surrogate, discussions with consultants, evaluation of patient's response to treatment, examination of patient, obtaining history from patient or surrogate, ordering and performing treatments and interventions, ordering and review of laboratory studies, ordering and review of radiographic studies, pulse oximetry, re-evaluation of patient's condition and review of old charts   Care discussed with: admitting provider      MEDICATIONS ORDERED IN ED: Medications  furosemide (LASIX)  injection 40 mg (has no administration in time range)  sodium chloride 0.9 % bolus 500 mL (has no administration in time range)  sodium zirconium cyclosilicate (LOKELMA) packet 10 g (has no administration in time range)  calcium gluconate inj 10% (1 g) URGENT USE ONLY! (has no administration in time range)  insulin aspart (novoLOG) injection 10 Units (has no administration in time range)    And  dextrose 50 % solution 100 mL (has no administration in time range)  sodium bicarbonate injection 50 mEq (has no administration in time range)  sodium chloride 0.9 % bolus 1,000 mL (has no administration in time range)     IMPRESSION / MDM / ASSESSMENT AND PLAN / ED COURSE  I reviewed the triage vital signs and the nursing notes.                              Differential diagnosis includes, but is not limited to, viral illness, pneumonia, AKI, electrolyte abnormality, anemia, anxiety  Patient's presentation is most consistent with acute presentation with potential threat to life or bodily function.  Patient presents with generalized weakness in the setting of recent viral symptoms and poor oral intake.  Vital signs are unremarkable.  Labs show AKI with a creatinine of 2 from a baseline of 1.0.  With this he has a sodium of 129 and potassium of 6.6 with EKG changes.  Patient is nontoxic, but requires IV calcium bicarb insulin glucose, Lokelma, Lasix and IV fluids for potassium management to maintain cardiac stability.  Case discussed with hospitalist for further management.       FINAL CLINICAL IMPRESSION(S) / ED DIAGNOSES   Final diagnoses:  Acute hyperkalemia  AKI (acute kidney injury) (HCC)     Rx / DC Orders   ED Discharge Orders     None        Note:  This document was prepared using Dragon voice recognition software and may include unintentional dictation errors.   Sharman Cheek, MD 02/11/22 2257

## 2022-02-11 NOTE — Assessment & Plan Note (Addendum)
Suspect related to high potassium intake from energy drink consumption in the setting of AKI related to dehydration Symptomatic for weakness, tingling Treated in the ED with an NS bolus, calcium gluconate, D50 and insulin, IV Lasix, sodium bicarb and Lokelma Continue to check potassium every 4 hours and treat Continuous cardiac monitoring Advised on the importance of hydration and moderation in energy drink consumption

## 2022-02-11 NOTE — Assessment & Plan Note (Signed)
Hypovolemic likely Continue IV hydration with NS

## 2022-02-11 NOTE — ED Triage Notes (Addendum)
Pt states that he was sick last week with cough and congestion and all his roommates got sick too, states his covid test was negative, pt reports that he has remnants of the cough and sore throat still and states today both of his legs felt heavy and he started feeling tingly all over, but feels some better now after talking with staff, no distress noted at this time   Pt states that he has been having some left sided chest pressure

## 2022-02-11 NOTE — Assessment & Plan Note (Signed)
Not currently on home meds.  Appears stable

## 2022-02-11 NOTE — Assessment & Plan Note (Signed)
Creatinine 1.99.  Most recent on record was 0.97 three years prior Secondary to ATN caused by poor oral intake of free water and dehydration Expecting improvement with IV hydration Monitor renal function, avoid nephrotoxins

## 2022-02-11 NOTE — H&P (Signed)
History and Physical    Patient: Noah Avery QVZ:[id number] DOB: 04/09/1971 DOA: 02/11/2022 DOS: the patient was seen and examined on 02/11/2022 PCP: Patient, No Pcp Per  Patient coming from: Home  Chief Complaint:  Chief Complaint  Patient presents with   Weakness   Sore Throat    HPI: Noah Avery is a 51 y.o. male with medical history significant for Hypertension and degenerative disc disease who presents to the ED with palpitations, weakness and a tingling sensation in the extremities that started the day prior.  Patient endorses drinking several energy drinks daily.  Reported he is recovering from a recent viral illness. ED course and data review: Afebrile, heart rate 96 with respirations 20, BP 126/95 and O2 sat 98% on room air.  EKG, personally reviewed and interpreted showed sinus at 96 with peaked T waves.  Labs significant for creatinine of 1.99 up from 0.97 about 3 years prior.  Sodium 129 and potassium 6.6.  WBC 14.6.  Troponin 4.  Chest x-ray nonacute. Treated in the ED with an NS bolus, calcium gluconate, D50 and insulin, IV Lasix, sodium bicarb and Lokelma.  Hospitalist consulted for admission.     Past Medical History:  Diagnosis Date   Depression    H/O degenerative disc disease    Past Surgical History:  Procedure Laterality Date   BACK SURGERY     Social History:  reports that he has been smoking. He has been smoking an average of 1 pack per day. He has never used smokeless tobacco. He reports current alcohol use. He reports that he does not use drugs.  Allergies  Allergen Reactions   Silver Nitrate     No family history on file.  Prior to Admission medications   Not on File    Physical Exam: Vitals:   02/11/22 1833 02/11/22 1834 02/11/22 2122  BP: (!) 133/93  (!) 126/95  Pulse: 86  96  Resp: 16  20  Temp: 98.3 F (36.8 C)  97.9 F (36.6 C)  TempSrc: Oral  Oral  SpO2: 97%  98%  Weight:  70.3 kg   Height:  5\' 10"  (1.778 m)    Physical  Exam  Labs on Admission: I have personally reviewed following labs and imaging studies  CBC: Recent Labs  Lab 02/11/22 1845  WBC 14.6*  HGB 15.3  HCT 44.7  MCV 92.7  PLT 290   Basic Metabolic Panel: Recent Labs  Lab 02/11/22 1845  NA 129*  K 6.6*  CL 93*  CO2 26  GLUCOSE 91  BUN 22*  CREATININE 1.99*  CALCIUM 9.0   GFR: Estimated Creatinine Clearance: 44.2 mL/min (A) (by C-G formula based on SCr of 1.99 mg/dL (H)). Liver Function Tests: No results for input(s): "AST", "ALT", "ALKPHOS", "BILITOT", "PROT", "ALBUMIN" in the last 168 hours. No results for input(s): "LIPASE", "AMYLASE" in the last 168 hours. No results for input(s): "AMMONIA" in the last 168 hours. Coagulation Profile: No results for input(s): "INR", "PROTIME" in the last 168 hours. Cardiac Enzymes: No results for input(s): "CKTOTAL", "CKMB", "CKMBINDEX", "TROPONINI" in the last 168 hours. BNP (last 3 results) No results for input(s): "PROBNP" in the last 8760 hours. HbA1C: No results for input(s): "HGBA1C" in the last 72 hours. CBG: No results for input(s): "GLUCAP" in the last 168 hours. Lipid Profile: No results for input(s): "CHOL", "HDL", "LDLCALC", "TRIG", "CHOLHDL", "LDLDIRECT" in the last 72 hours. Thyroid Function Tests: No results for input(s): "TSH", "T4TOTAL", "FREET4", "T3FREE", "THYROIDAB" in the  last 72 hours. Anemia Panel: No results for input(s): "VITAMINB12", "FOLATE", "FERRITIN", "TIBC", "IRON", "RETICCTPCT" in the last 72 hours. Urine analysis: No results found for: "COLORURINE", "APPEARANCEUR", "LABSPEC", "PHURINE", "GLUCOSEU", "HGBUR", "BILIRUBINUR", "KETONESUR", "PROTEINUR", "UROBILINOGEN", "NITRITE", "LEUKOCYTESUR"  Radiological Exams on Admission: DG Chest 2 View  Result Date: 02/11/2022 CLINICAL DATA:  Chest pain EXAM: CHEST - 2 VIEW COMPARISON:  Radiographs 03/24/2018 FINDINGS: No focal consolidation, pleural effusion, or pneumothorax. Normal cardiomediastinal silhouette.  No acute osseous abnormality. IMPRESSION: No active cardiopulmonary disease. Electronically Signed   By: Minerva Fester M.D.   On: 02/11/2022 19:05     Data Reviewed: Relevant notes from primary care and specialist visits, past discharge summaries as available in EHR, including Care Everywhere. Prior diagnostic testing as pertinent to current admission diagnoses Updated medications and problem lists for reconciliation ED course, including vitals, labs, imaging, treatment and response to treatment Triage notes, nursing and pharmacy notes and ED provider's notes Notable results as noted in HPI   Assessment and Plan: * Hyperkalemia Suspect related to high potassium intake from energy drink consumption in the setting of AKI related to dehydration Symptomatic for weakness, tingling Treated in the ED with an NS bolus, calcium gluconate, D50 and insulin, IV Lasix, sodium bicarb and Lokelma Continue to check potassium every 4 hours and treat Continuous cardiac monitoring Advised on the importance of hydration and moderation in energy drink consumption  AKI (acute kidney injury) (HCC) Creatinine 1.99.  Most recent on record was 0.97 three years prior Secondary to ATN caused by poor oral intake of free water and dehydration Expecting improvement with IV hydration Monitor renal function, avoid nephrotoxins  Hyponatremia Hypovolemic likely Continue IV hydration with NS  DDD (degenerative disc disease), lumbar Not currently on home meds.  Appears stable  HTN (hypertension) Fair control of blood pressure. Patient not currently on home meds.  We will continue to monitor for now    DVT prophylaxis: Lovenox  Consults: none  Advance Care Planning: full code  Family Communication: none  Disposition Plan: Back to previous home environment  Severity of Illness: The appropriate patient status for this patient is OBSERVATION. Observation status is judged to be reasonable and necessary in  order to provide the required intensity of service to ensure the patient's safety. The patient's presenting symptoms, physical exam findings, and initial radiographic and laboratory data in the context of their medical condition is felt to place them at decreased risk for further clinical deterioration. Furthermore, it is anticipated that the patient will be medically stable for discharge from the hospital within 2 midnights of admission.   Author: Andris Baumann, MD 02/11/2022 9:47 PM  For on call review [email or web address].

## 2022-02-11 NOTE — ED Notes (Signed)
Lab reports K+ 6.6; acuity level changed and will take pt to next available exam room

## 2022-02-11 NOTE — ED Notes (Signed)
Pt reports today, that he was riding home from work, and started feeling a tingling sensation all over his body, along with some weakness.  Pt states he does not currently have any weakness, tingling, sob or chest pain.  Pt is A&O x4 at this time in NAD sitting in stretcher.

## 2022-02-11 NOTE — Assessment & Plan Note (Signed)
Fair control of blood pressure. Patient not currently on home meds.  We will continue to monitor for now

## 2022-02-12 LAB — BASIC METABOLIC PANEL
Anion gap: 5 (ref 5–15)
BUN: 21 mg/dL — ABNORMAL HIGH (ref 6–20)
CO2: 27 mmol/L (ref 22–32)
Calcium: 8.2 mg/dL — ABNORMAL LOW (ref 8.9–10.3)
Chloride: 102 mmol/L (ref 98–111)
Creatinine, Ser: 1.7 mg/dL — ABNORMAL HIGH (ref 0.61–1.24)
GFR, Estimated: 49 mL/min — ABNORMAL LOW (ref >60)
Glucose, Bld: 109 mg/dL — ABNORMAL HIGH (ref 70–99)
Potassium: 4.5 mmol/L (ref 3.5–5.1)
Sodium: 134 mmol/L — ABNORMAL LOW (ref 135–145)

## 2022-02-12 LAB — CBG MONITORING, ED: Glucose-Capillary: 160 mg/dL — ABNORMAL HIGH (ref 70–99)

## 2022-02-12 LAB — HEMOGLOBIN A1C
Hgb A1c MFr Bld: 5.3 % (ref 4.8–5.6)
Mean Plasma Glucose: 105.41 mg/dL

## 2022-02-12 LAB — POTASSIUM: Potassium: 3.7 mmol/L (ref 3.5–5.1)

## 2022-02-12 LAB — HIV ANTIBODY (ROUTINE TESTING W REFLEX): HIV Screen 4th Generation wRfx: NONREACTIVE

## 2022-02-12 MED ORDER — NICOTINE 21 MG/24HR TD PT24
21.0000 mg | MEDICATED_PATCH | Freq: Every day | TRANSDERMAL | Status: DC
  Administered 2022-02-12: 21 mg via TRANSDERMAL

## 2022-02-12 NOTE — Hospital Course (Signed)
Taken from H&P.  Noah Avery is a 51 y.o. male with medical history significant for Hypertension and degenerative disc disease who presents to the ED with palpitations, weakness and a tingling sensation in the extremities that started the day prior.  Patient endorses drinking several energy drinks daily.  Has drank " red bull" in the past as well as "monster" but started drinking "body armor" 28 ounces x 4 daily about a month ago.  He said it never caused a problem for for him.  He had a viral illness recently and went back out to work a few days ago and continued his usual routine of drinking the body armor drink and then started feeling ill with the weakness and tingling.  Says he drinks the energy drinks because he sweats a lot on his job.   ED course and data review: Afebrile, heart rate 96 with respirations 20, BP 126/95 and O2 sat 98% on room air.  EKG, personally reviewed and interpreted showed sinus at 96 with peaked T waves.  Labs significant for creatinine of 1.99 up from 0.97 about 3 years prior.  Sodium 129 and potassium 6.6.  WBC 14.6.  Troponin 4.  Chest x-ray nonacute. Treated in the ED with an NS bolus, calcium gluconate, D50 and insulin, IV Lasix, sodium bicarb and Lokelma.  Hospitalist consulted for admission.   8/17: Hyperkalemia resolved hyperkalemia resolved, potassium at 4.5 this morning.  Renal functions improving, creatinine at 1.70 today.  Has not seen physician and not taking any medications for some time.  History of hypertension. Patient appears to be at his baseline but need to have a close follow-up with primary care provider and have his renal function checked appropriately. Patient was also instructed not to use his energy drinks and follow-up closely with his primary care provider for further recommendations.

## 2022-02-12 NOTE — Progress Notes (Signed)
IV removed. Discharge instructions given. Verbalized understanding. No acute distress at this time. Patient to transport himself home.

## 2022-02-12 NOTE — ED Notes (Signed)
Pt brought to ED rm 33, this RN now assuming care.

## 2022-02-12 NOTE — TOC Initial Note (Signed)
Transition of Care [facility]) - Initial/Assessment Note    Patient Details  Name: Noah Avery MRN: [id number] Date of Birth: 12-29-1970  Transition of Care [facility] CM/SW Contact:    Shelbie Hutching, RN Phone Number: 02/12/2022, 12:28 PM  Clinical Narrative:                 Patient placed under observation in the hospital for hyperkalemia, now resolved and ready to go home.   RNCM met with patient at the bedside introduced self and explained role in DC planning.  Provided patient with information on [employer], informed him that he may want to try [facility] in [facility] as they usually can see patient's sooner than some of the others.  Patient reports that he does not live far from there, he has transportation.  No medications prescribed this visit.    Patient needs to have BNP drawn in 1 week.  Informed patient that if he cannot get into [facility] or any of the other clinics within that time that he should follow up at the [facility] walk in clinic.  Patient verbalizes understanding.  Patient will drive himself home today.   Expected Discharge Plan: Home/Self Care Barriers to Discharge: Barriers Resolved   Patient Goals and CMS Choice Patient states their goals for this hospitalization and ongoing recovery are:: glad to be going hom      Expected Discharge Plan and Services Expected Discharge Plan: Home/Self Care   Discharge Planning Services: CM Consult   Living arrangements for the past 2 months: Mobile Home Expected Discharge Date: 02/12/22               DME Arranged: N/A DME Agency: NA       HH Arranged: NA [place] Agency: NA        Prior Living Arrangements/Services Living arrangements for the past 2 months: Mobile Home Lives with:: Roommate Patient language and need for interpreter reviewed:: Yes Do you feel safe going back to the place where you live?: Yes      Need for Family Participation in Patient Care: No (Comment)     Criminal Activity/Legal  Involvement Pertinent to Current Situation/Hospitalization: No - Comment as needed  Activities of Daily Living      Permission Sought/Granted                  Emotional Assessment Appearance:: Appears stated age Attitude/Demeanor/Rapport: Engaged Affect (typically observed): Accepting Orientation: : Oriented to Self, Oriented to Place, Oriented to  Time, Oriented to Situation Alcohol / Substance Use: Not Applicable Psych Involvement: No (comment)  Admission diagnosis:  Hyperkalemia [E87.5] Patient Active Problem List   Diagnosis Date Noted   Hyperkalemia 02/11/2022   DDD (degenerative disc disease), lumbar 02/11/2022   AKI (acute kidney injury) ([place]) 02/11/2022   Hyponatremia 02/11/2022   Major depressive disorder, recurrent severe without psychotic features ([place]) 01/04/2016   Tobacco use disorder 01/04/2016   CONSTIPATION 04/22/2010   ABDOMINAL PAIN 04/22/2010   LUMBAR RADICULOPATHY 08/05/2009   HYPERLIPIDEMIA 10/25/2008   DEPRESSION 10/25/2008   MIGRAINE, COMMON 10/25/2008   HTN (hypertension) 10/25/2008   HEMORRHOIDS 10/25/2008   DENTAL CARIES 10/25/2008   BLOOD IN STOOL 10/25/2008   BACK PAIN 10/25/2008   CARDIAC MURMUR 10/25/2008   PCP:  Patient, No Pcp Per Pharmacy:   [facility] [address] ([address]), [place] [address] [place])  [id number] Phone: [phone] Fax: [phone]     Social Determinants  of Health (SDOH) Interventions    Readmission Risk Interventions     No data to display

## 2022-02-12 NOTE — ED Notes (Signed)
This writer went into room to round on pt.  Pt seen in room, drenched in sweat, stating he doesn't feel right.  Pts CBG checked and found to be 58.  Pt given 8oz OJ, sandwich tray and coke.  Pt is A&O x4 at this time.

## 2022-02-12 NOTE — Discharge Summary (Signed)
Physician Discharge Summary   Patient: Noah Avery MRN: [id number] DOB: 09-May-1971  Admit date:     02/11/2022  Discharge date: 02/12/22  Discharge Physician: Arnetha Courser   PCP: Patient, No Pcp Per   Recommendations at discharge:  Please obtain BMP in 1 week  Discharge Diagnoses: Principal Problem:   Hyperkalemia Active Problems:   AKI (acute kidney injury) (HCC)   Hyponatremia   HTN (hypertension)   DDD (degenerative disc disease), lumbar  Resolved Problems:   * No resolved hospital problems. [facility] Course: Taken from H&P.  Noah Avery is a 51 y.o. male with medical history significant for Hypertension and degenerative disc disease who presents to the ED with palpitations, weakness and a tingling sensation in the extremities that started the day prior.  Patient endorses drinking several energy drinks daily.  Has drank " red bull" in the past as well as "monster" but started drinking "body armor" 28 ounces x 4 daily about a month ago.  He said it never caused a problem for for him.  He had a viral illness recently and went back out to work a few days ago and continued his usual routine of drinking the body armor drink and then started feeling ill with the weakness and tingling.  Says he drinks the energy drinks because he sweats a lot on his job.   ED course and data review: Afebrile, heart rate 96 with respirations 20, BP 126/95 and O2 sat 98% on room air.  EKG, personally reviewed and interpreted showed sinus at 96 with peaked T waves.  Labs significant for creatinine of 1.99 up from 0.97 about 3 years prior.  Sodium 129 and potassium 6.6.  WBC 14.6.  Troponin 4.  Chest x-ray nonacute. Treated in the ED with an NS bolus, calcium gluconate, D50 and insulin, IV Lasix, sodium bicarb and Lokelma.  Hospitalist consulted for admission.   8/17: Hyperkalemia resolved hyperkalemia resolved, potassium at 4.5 this morning.  Renal functions improving, creatinine at 1.70 today.  Has not  seen physician and not taking any medications for some time.  History of hypertension. Patient appears to be at his baseline but need to have a close follow-up with primary care provider and have his renal function checked appropriately. Patient was also instructed not to use his energy drinks and follow-up closely with his primary care provider for further recommendations.    Assessment and Plan: * Hyperkalemia Suspect related to high potassium intake from energy drink consumption in the setting of AKI related to dehydration Symptomatic for weakness, tingling Treated in the ED with an NS bolus, calcium gluconate, D50 and insulin, IV Lasix, sodium bicarb and Lokelma Continue to check potassium every 4 hours and treat Continuous cardiac monitoring Advised on the importance of hydration and moderation in energy drink consumption  AKI (acute kidney injury) (HCC) Creatinine 1.99.  Most recent on record was 0.97 three years prior Secondary to ATN caused by poor oral intake of free water and dehydration Expecting improvement with IV hydration Monitor renal function, avoid nephrotoxins  Hyponatremia Hypovolemic likely Continue IV hydration with NS  DDD (degenerative disc disease), lumbar Not currently on home meds.  Appears stable  HTN (hypertension) Fair control of blood pressure. Patient not currently on home meds.  We will continue to monitor for now   Consultants: None Procedures performed: None Disposition: Home Diet recommendation:  Discharge Diet Orders (From admission, onward)     Start     Ordered   02/12/22  0000  Diet - low sodium heart healthy        02/12/22 1201           Cardiac diet DISCHARGE MEDICATION: Allergies as of 02/12/2022       Reactions   Silver Nitrate         Medication List    You have not been prescribed any medications.     Discharge Exam: Filed Weights   02/11/22 1834  Weight: 70.3 kg   General.     In no acute  distress. Pulmonary.  Lungs clear bilaterally, normal respiratory effort. CV.  Regular rate and rhythm, no JVD, rub or murmur. Abdomen.  Soft, nontender, nondistended, BS positive. CNS.  Alert and oriented .  No focal neurologic deficit. Extremities.  No edema, no cyanosis, pulses intact and symmetrical. Psychiatry.  Judgment and insight appears normal.   Condition at discharge: stable  The results of significant diagnostics from this hospitalization (including imaging, microbiology, ancillary and laboratory) are listed below for reference.   Imaging Studies: DG Chest 2 View  Result Date: 02/11/2022 CLINICAL DATA:  Chest pain EXAM: CHEST - 2 VIEW COMPARISON:  Radiographs 03/24/2018 FINDINGS: No focal consolidation, pleural effusion, or pneumothorax. Normal cardiomediastinal silhouette. No acute osseous abnormality. IMPRESSION: No active cardiopulmonary disease. Electronically Signed   By: Minerva Fester M.D.   On: 02/11/2022 19:05    Microbiology: Results for orders placed or performed during the hospital encounter of 06/07/21  Resp Panel by RT-PCR (Flu A&B, Covid) Nasopharyngeal Swab     Status: Abnormal   Collection Time: 06/07/21  2:08 PM   Specimen: Nasopharyngeal Swab; Nasopharyngeal(NP) swabs in vial transport medium  Result Value Ref Range Status   SARS Coronavirus 2 by RT PCR NEGATIVE NEGATIVE Final    Comment: (NOTE) SARS-CoV-2 target nucleic acids are NOT DETECTED.  The SARS-CoV-2 RNA is generally detectable in upper respiratory specimens during the acute phase of infection. The lowest concentration of SARS-CoV-2 viral copies this assay can detect is 138 copies/mL. A negative result does not preclude SARS-Cov-2 infection and should not be used as the sole basis for treatment or other patient management decisions. A negative result may occur with  improper specimen collection/handling, submission of specimen other than nasopharyngeal swab, presence of viral mutation(s)  within the areas targeted by this assay, and inadequate number of viral copies(<138 copies/mL). A negative result must be combined with clinical observations, patient history, and epidemiological information. The expected result is Negative.  Fact Sheet for Patients:  BloggerCourse.com  Fact Sheet for Healthcare Providers:  SeriousBroker.it  This test is no t yet approved or cleared by the [place] FDA and  has been authorized for detection and/or diagnosis of SARS-CoV-2 by FDA under an Emergency Use Authorization (EUA). This EUA will remain  in effect (meaning this test can be used) for the duration of the COVID-19 declaration under Section 564(b)(1) of the Act, 21 U.S.C.section 360bbb-3(b)(1), unless the authorization is terminated  or revoked sooner.       Influenza A by PCR POSITIVE (A) NEGATIVE Final   Influenza B by PCR NEGATIVE NEGATIVE Final    Comment: (NOTE) The Xpert Xpress SARS-CoV-2/FLU/RSV plus assay is intended as an aid in the diagnosis of influenza from Nasopharyngeal swab specimens and should not be used as a sole basis for treatment. Nasal washings and aspirates are unacceptable for Xpert Xpress SARS-CoV-2/FLU/RSV testing.  Fact Sheet for Patients: BloggerCourse.com  Fact Sheet for Healthcare Providers: SeriousBroker.it  This test is not yet  approved or cleared by the [place] and has been authorized for detection and/or diagnosis of SARS-CoV-2 by FDA under an Emergency Use Authorization (EUA). This EUA will remain in effect (meaning this test can be used) for the duration of the COVID-19 declaration under Section 564(b)(1) of the Act, 21 U.S.C. section 360bbb-3(b)(1), unless the authorization is terminated or revoked.  Performed at [facility], [address] [place], [place] [address]     Labs: CBC: Recent Labs  Lab  02/11/22 1845  WBC 14.6*  HGB 15.3  HCT 44.7  MCV 92.7  PLT 290   Basic Metabolic Panel: Recent Labs  Lab 02/11/22 1845 02/11/22 2300 02/12/22 0517  NA 129*  --  134*  K 6.6* 3.7 4.5  CL 93*  --  102  CO2 26  --  27  GLUCOSE 91  --  109*  BUN 22*  --  21*  CREATININE 1.99*  --  1.70*  CALCIUM 9.0  --  8.2*   Liver Function Tests: No results for input(s): "AST", "ALT", "ALKPHOS", "BILITOT", "PROT", "ALBUMIN" in the last 168 hours. CBG: Recent Labs  Lab 02/11/22 2343 02/12/22 0047  GLUCAP 58* 160*    Discharge time spent: greater than 30 minutes.  This record has been created using Conservation officer, historic buildings. Errors have been sought and corrected,but may not always be located. Such creation errors do not reflect on the standard of care.   Signed: Arnetha Courser, MD [facility] 02/12/2022

## 2022-02-20 IMAGING — DX DG FOOT COMPLETE 3+V*L*
3 series · 3 of 3 positions shown · non-contrast
Comparison: None.

CLINICAL DATA: Great toe pain.

EXAM:
LEFT FOOT - COMPLETE 3+ VIEW

[foot ap]
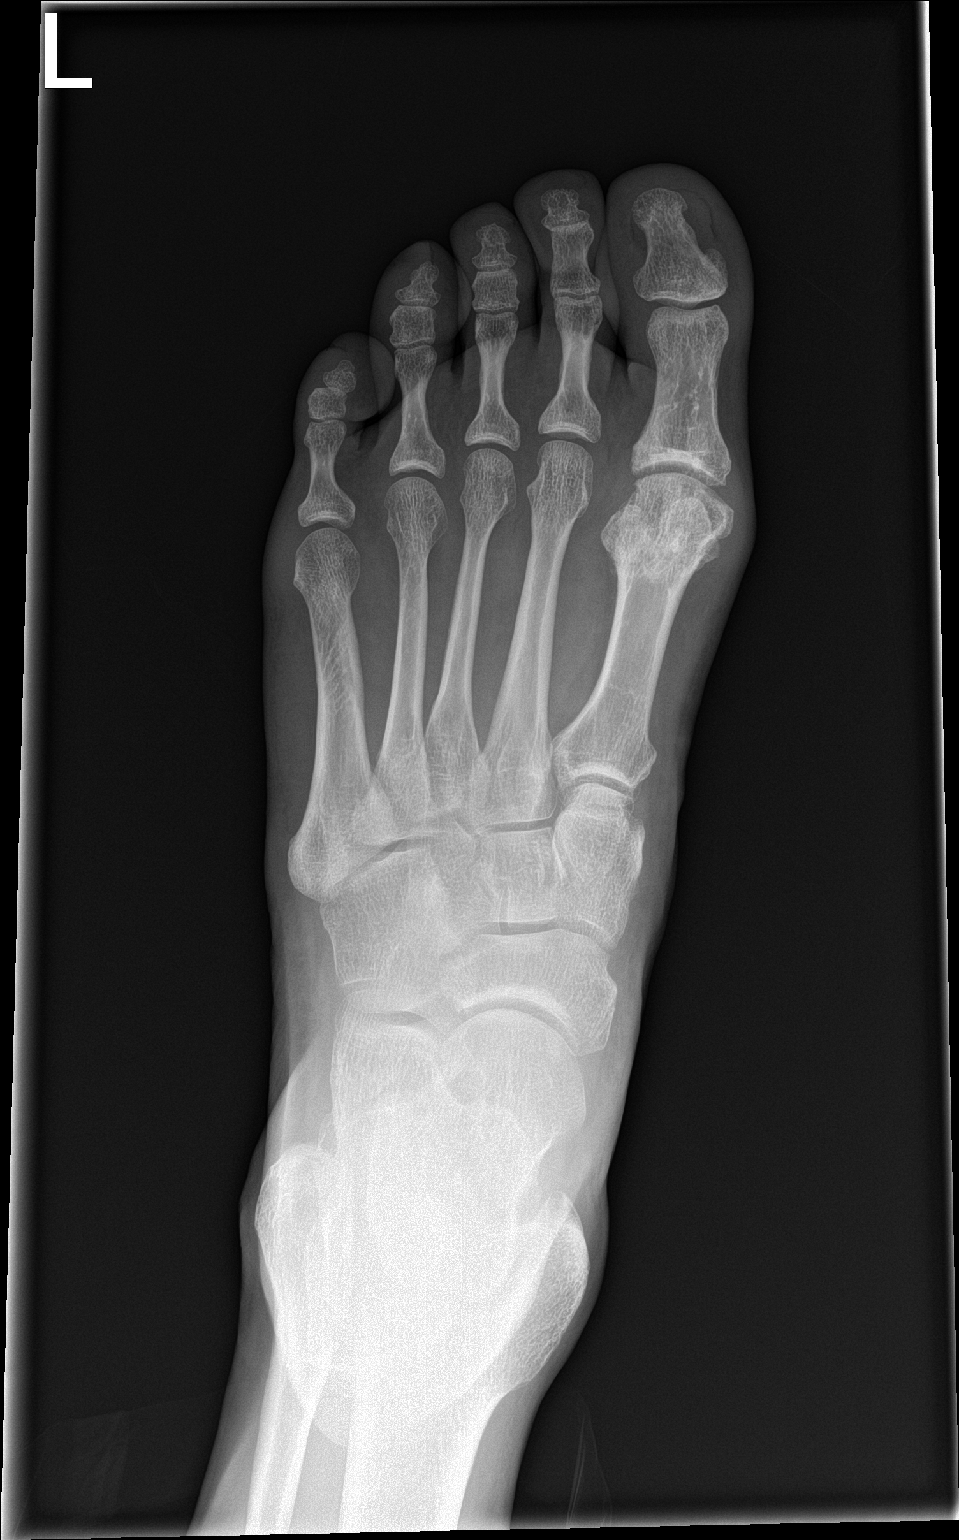

[foot obl]
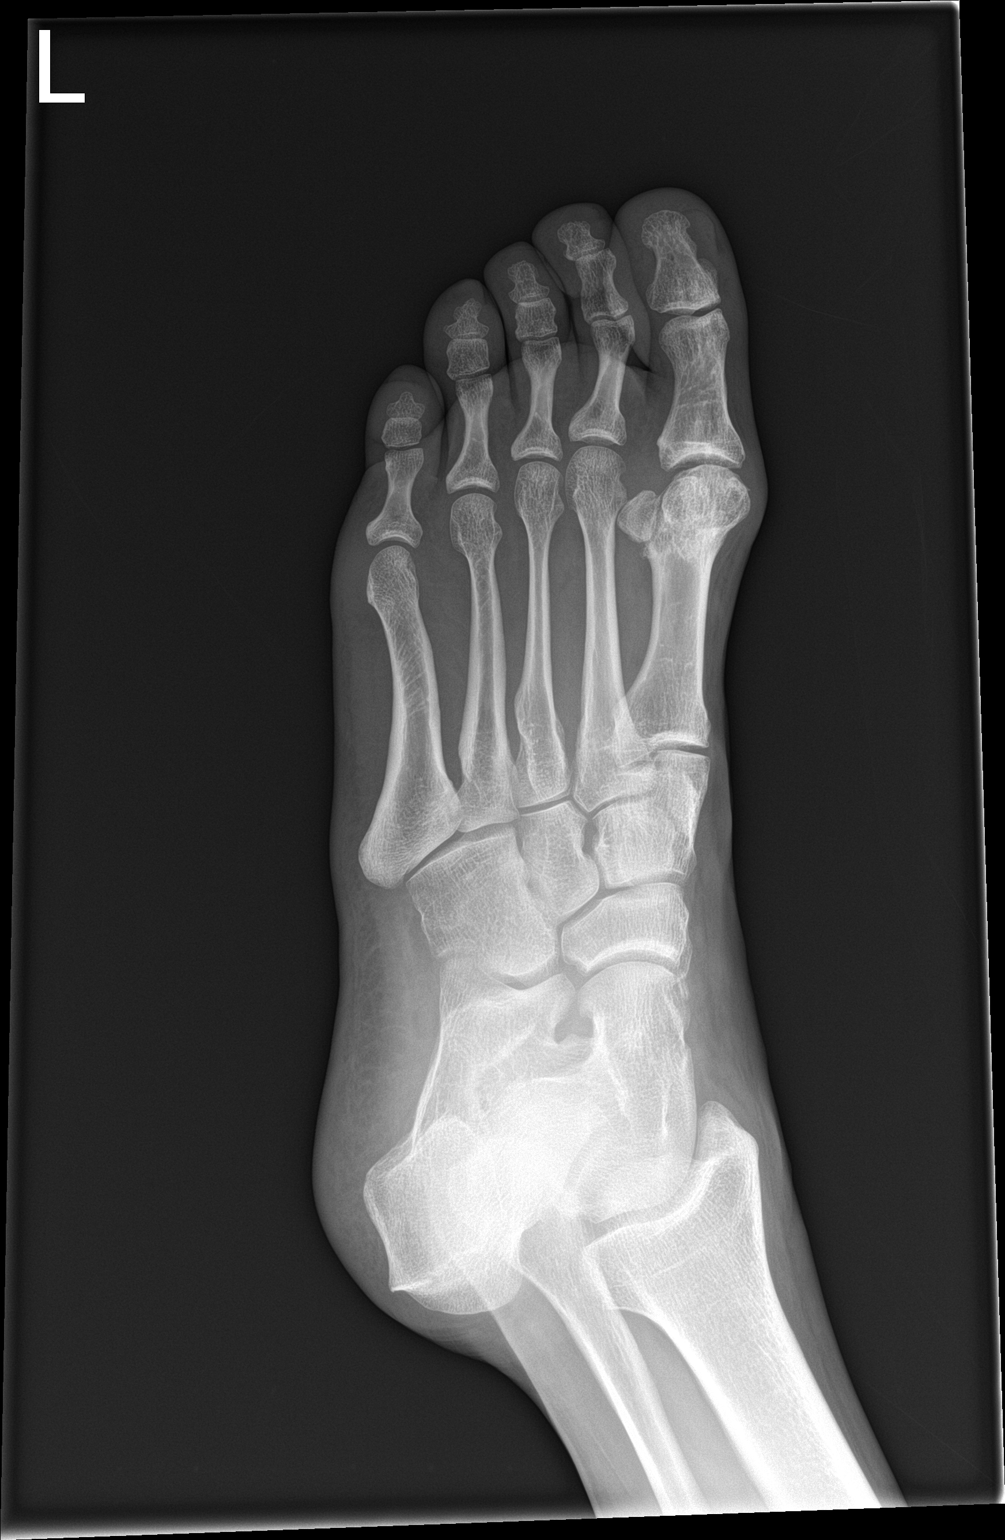

[foot lat]
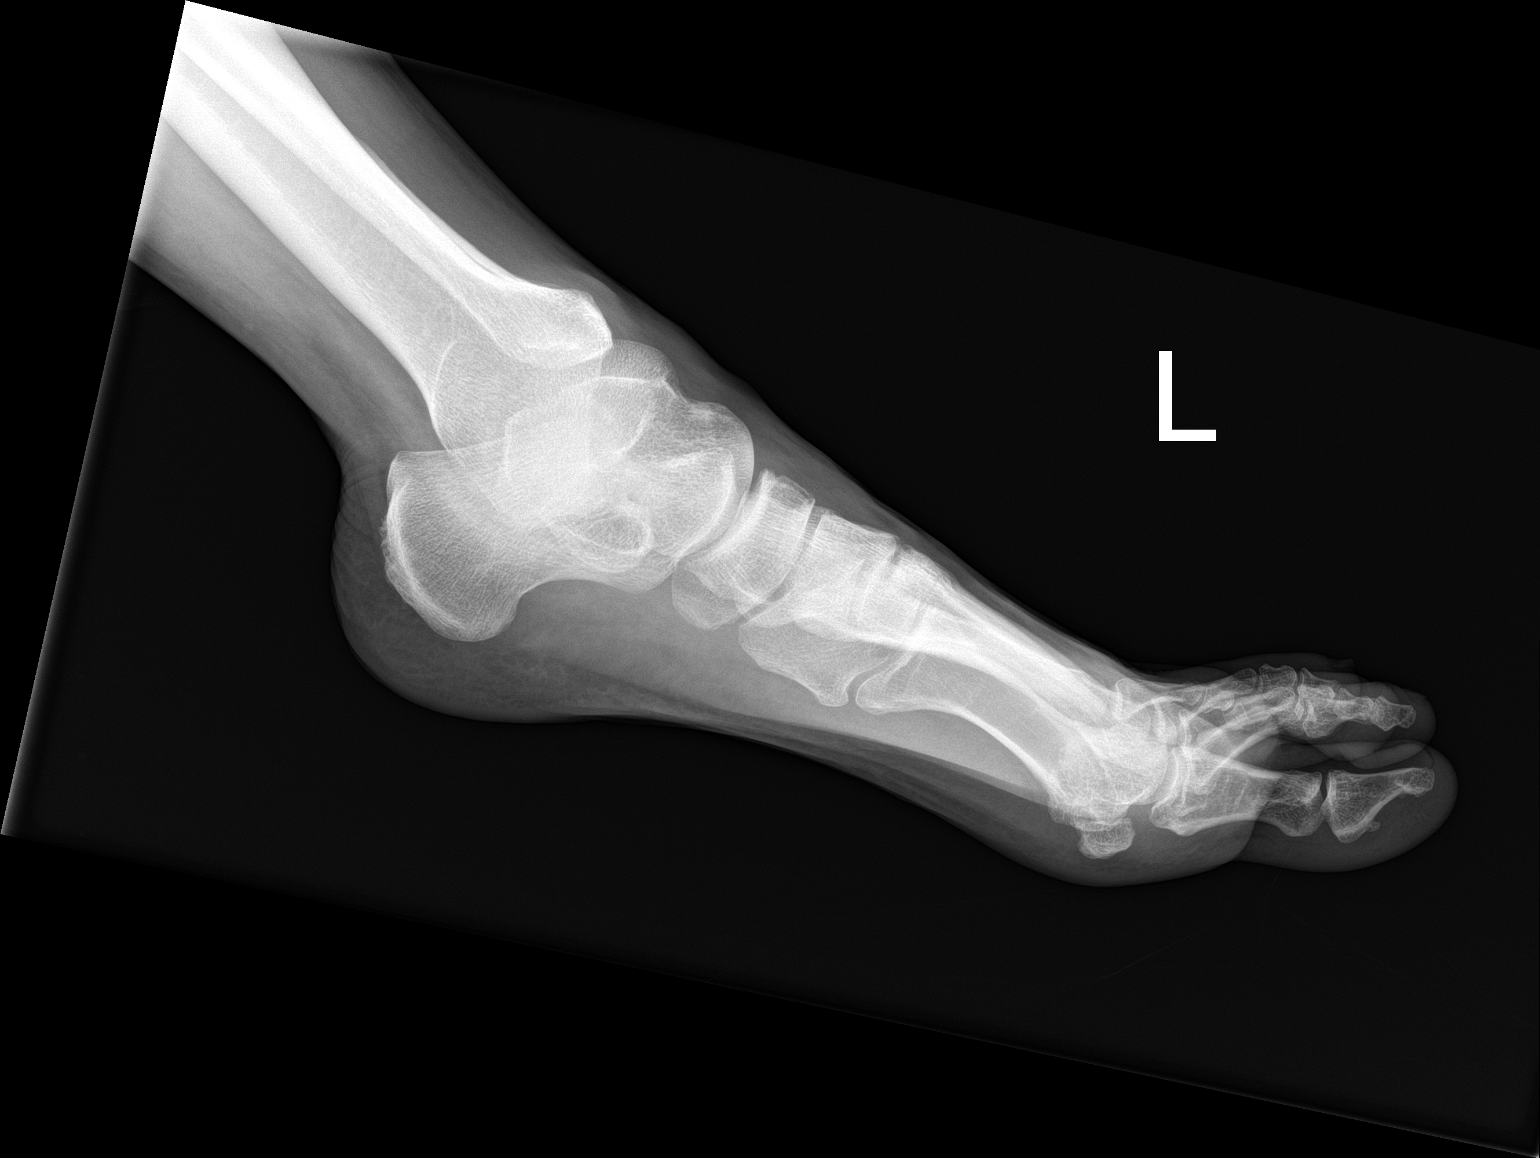

[3 of 3 positions shown; findings below may reference images not displayed]

FINDINGS: No fracture. No subluxation or dislocation. Degenerative changes are
noted in the MTP joint of the great toe. No worrisome lytic or
sclerotic osseous abnormality.
IMPRESSION: 1. No acute bony abnormality.
2. Degenerative changes in the MTP joint of the great toe.

## 2022-11-16 DIAGNOSIS — F329 Major depressive disorder, single episode, unspecified: Secondary | ICD-10-CM | POA: Insufficient documentation

## 2022-11-16 DIAGNOSIS — F17210 Nicotine dependence, cigarettes, uncomplicated: Secondary | ICD-10-CM | POA: Insufficient documentation

## 2022-11-16 DIAGNOSIS — R45851 Suicidal ideations: Secondary | ICD-10-CM | POA: Insufficient documentation

## 2022-11-16 LAB — COMPREHENSIVE METABOLIC PANEL
ALT: 18 U/L (ref 0–44)
AST: 32 U/L (ref 15–41)
Albumin: 4.9 g/dL (ref 3.5–5.0)
Alkaline Phosphatase: 79 U/L (ref 38–126)
Anion gap: 11 (ref 5–15)
BUN: 13 mg/dL (ref 6–20)
CO2: 25 mmol/L (ref 22–32)
Calcium: 9.4 mg/dL (ref 8.9–10.3)
Chloride: 98 mmol/L (ref 98–111)
Creatinine, Ser: 1.21 mg/dL (ref 0.61–1.24)
GFR, Estimated: >60 mL/min (ref >60)
Glucose, Bld: 107 mg/dL — ABNORMAL HIGH (ref 70–99)
Potassium: 3.8 mmol/L (ref 3.5–5.1)
Sodium: 134 mmol/L — ABNORMAL LOW (ref 135–145)
Total Bilirubin: 0.6 mg/dL (ref 0.3–1.2)
Total Protein: 7.8 g/dL (ref 6.5–8.1)

## 2022-11-16 LAB — URINE DRUG SCREEN, QUALITATIVE (ARMC ONLY)
Amphetamines, Ur Screen: NOT DETECTED
Barbiturates, Ur Screen: NOT DETECTED
Benzodiazepine, Ur Scrn: NOT DETECTED
Cannabinoid 50 Ng, Ur ~~LOC~~: NOT DETECTED
Cocaine Metabolite,Ur ~~LOC~~: NOT DETECTED
MDMA (Ecstasy)Ur Screen: NOT DETECTED
Methadone Scn, Ur: NOT DETECTED
Opiate, Ur Screen: NOT DETECTED
Phencyclidine (PCP) Ur S: NOT DETECTED
Tricyclic, Ur Screen: NOT DETECTED

## 2022-11-16 LAB — CBC
HCT: 43.8 % (ref 39.0–52.0)
Hemoglobin: 14.3 g/dL (ref 13.0–17.0)
MCH: 31 pg (ref 26.0–34.0)
MCHC: 32.6 g/dL (ref 30.0–36.0)
MCV: 94.8 fL (ref 80.0–100.0)
Platelets: 222 10*3/uL (ref 150–400)
RBC: 4.62 MIL/uL (ref 4.22–5.81)
RDW: 14 % (ref 11.5–15.5)
WBC: 15.6 10*3/uL — ABNORMAL HIGH (ref 4.0–10.5)
nRBC: 0 % (ref 0.0–0.2)

## 2022-11-16 LAB — ACETAMINOPHEN LEVEL: Acetaminophen (Tylenol), Serum: <10 ug/mL — ABNORMAL LOW (ref 10–30)

## 2022-11-16 LAB — ETHANOL: Alcohol, Ethyl (B): <10 mg/dL (ref <10)

## 2022-11-16 LAB — SALICYLATE LEVEL: Salicylate Lvl: <7 mg/dL — ABNORMAL LOW (ref 7.0–30.0)

## 2022-11-16 NOTE — ED Provider Triage Note (Signed)
Emergency Medicine Provider Triage Evaluation Note  Noah Avery, a 52 y.o. male  was evaluated in triage.  Pt complains of SI over the last 6 to 7 months.  Patient denies any attempt or plan.  He is currently on un-homed after leaving his permanent residence two weeks ago.   Review of Systems  Positive: SI Negative: CP, SOB  Physical Exam  BP (!) 143/96 (BP Location: Left Arm)   Pulse (!) 109   Temp 99.1 F (37.3 C) (Oral)   Resp 18   Ht 5\' 10"  (1.778 m)   Wt 77.1 kg   SpO2 96%   BMI 24.39 kg/m  Gen:   Awake, no distress  NAD Resp:  Normal effort  MSK:   Moves extremities without difficulty  Other:  SI  Medical Decision Making  Medically screening exam initiated at 6:56 PM.  Appropriate orders placed.  Noah Avery was informed that the remainder of the evaluation will be completed by another provider, this initial triage assessment does not replace that evaluation, and the importance of remaining in the ED until their evaluation is complete.  Patient to the ED for evaluation of SI complicated by him being recently un-homed.   Noah Hoard, PA-C 11/16/22 1902

## 2022-11-16 NOTE — ED Triage Notes (Addendum)
Pt sts that he has been having thought of SI for the last six or seven months. Pt denies any attempt or plan. Pt is homeless at this time. Pt sts that the landlord was able to help him out for the last six months until two weeks ago, when pt moved out of his residence.

## 2022-11-17 ENCOUNTER — Encounter: Payer: Self-pay | Admitting: Psychiatry

## 2022-11-17 ENCOUNTER — Emergency Department (EMERGENCY_DEPARTMENT_HOSPITAL)
Admission: EM | Admit: 2022-11-17 | Discharge: 2022-11-17 | Disposition: A | Discharge status: Other Acute Inpatient Hospital | Payer: Medicaid Other | Source: Home / Self Care | Attending: Emergency Medicine | Admitting: Emergency Medicine

## 2022-11-17 ENCOUNTER — Other Ambulatory Visit: Payer: Self-pay

## 2022-11-17 ENCOUNTER — Inpatient Hospital Stay
Admission: AD | Admit: 2022-11-17 | Discharge: 2022-12-01 | DRG: 885 | Disposition: A | Discharge status: Home / Self Care | Payer: Medicaid Other | Source: Intra-hospital | Attending: Psychiatry | Admitting: Psychiatry

## 2022-11-17 DIAGNOSIS — Z560 Unemployment, unspecified: Secondary | ICD-10-CM | POA: Diagnosis not present

## 2022-11-17 DIAGNOSIS — F332 Major depressive disorder, recurrent severe without psychotic features: Principal | ICD-10-CM | POA: Diagnosis present

## 2022-11-17 DIAGNOSIS — F419 Anxiety disorder, unspecified: Secondary | ICD-10-CM | POA: Diagnosis present

## 2022-11-17 DIAGNOSIS — Z5941 Food insecurity: Secondary | ICD-10-CM | POA: Diagnosis not present

## 2022-11-17 DIAGNOSIS — F17210 Nicotine dependence, cigarettes, uncomplicated: Secondary | ICD-10-CM | POA: Diagnosis present

## 2022-11-17 DIAGNOSIS — R45851 Suicidal ideations: Secondary | ICD-10-CM | POA: Diagnosis not present

## 2022-11-17 DIAGNOSIS — G4700 Insomnia, unspecified: Secondary | ICD-10-CM | POA: Diagnosis present

## 2022-11-17 DIAGNOSIS — Z5900 Homelessness unspecified: Secondary | ICD-10-CM

## 2022-11-17 DIAGNOSIS — Z5982 Transportation insecurity: Secondary | ICD-10-CM | POA: Diagnosis not present

## 2022-11-17 MED ORDER — TRAZODONE HCL 50 MG PO TABS
50.0000 mg | ORAL_TABLET | Freq: Every evening | ORAL | Status: DC | PRN
  Administered 2022-11-18: 50 mg via ORAL
  Filled 2022-11-17: qty 1

## 2022-11-17 MED ORDER — ACETAMINOPHEN 325 MG PO TABS
650.0000 mg | ORAL_TABLET | Freq: Four times a day (QID) | ORAL | Status: DC | PRN
  Administered 2022-11-18 – 2022-11-21 (×6): 650 mg via ORAL
  Filled 2022-11-17 (×6): qty 2

## 2022-11-17 MED ORDER — HALOPERIDOL 5 MG PO TABS: 5.0000 mg | ORAL_TABLET | Freq: Three times a day (TID) | ORAL | Status: AC | PRN

## 2022-11-17 MED ORDER — LORAZEPAM 2 MG PO TABS: 2.0000 mg | ORAL_TABLET | Freq: Three times a day (TID) | ORAL | Status: AC | PRN

## 2022-11-17 MED ORDER — ALUM & MAG HYDROXIDE-SIMETH 200-200-20 MG/5ML PO SUSP: 30.0000 mL | ORAL | Status: DC | PRN

## 2022-11-17 MED ORDER — MAGNESIUM HYDROXIDE 400 MG/5ML PO SUSP: 30.0000 mL | Freq: Every day | ORAL | Status: DC | PRN

## 2022-11-17 MED ORDER — HALOPERIDOL LACTATE 5 MG/ML IJ SOLN: 5.0000 mg | Freq: Three times a day (TID) | INTRAMUSCULAR | Status: AC | PRN

## 2022-11-17 MED ORDER — DIPHENHYDRAMINE HCL 25 MG PO CAPS: 50.0000 mg | ORAL_CAPSULE | Freq: Three times a day (TID) | ORAL | Status: AC | PRN

## 2022-11-17 MED ORDER — HYDROXYZINE HCL 25 MG PO TABS
25.0000 mg | ORAL_TABLET | Freq: Three times a day (TID) | ORAL | Status: DC | PRN
  Administered 2022-11-18 – 2022-12-01 (×27): 25 mg via ORAL
  Filled 2022-11-17 (×27): qty 1

## 2022-11-17 MED ORDER — LORAZEPAM 2 MG/ML IJ SOLN: 2.0000 mg | Freq: Three times a day (TID) | INTRAMUSCULAR | Status: AC | PRN

## 2022-11-17 MED ORDER — DIPHENHYDRAMINE HCL 50 MG/ML IJ SOLN: 50.0000 mg | Freq: Three times a day (TID) | INTRAMUSCULAR | Status: AC | PRN

## 2022-11-17 NOTE — ED Notes (Signed)
Patient eating breakfast at this time. 

## 2022-11-17 NOTE — BH Assessment (Signed)
Patient is to be admitted to [facility] by Dr. Toni Amend on today 11/17/22.  Attending Physician will be Dr.  Toni Amend .   Patient has been assigned to room 311, by [facility] Charge Nurse Wylene Men.    ER staff is aware of the admission: Fleet Contras, ER Secretary   Dr. Derrill Kay, ER MD  Eileen Stanford, Patient's Nurse

## 2022-11-17 NOTE — Progress Notes (Signed)
Admission note:  Consents signed, literature detailing the patient's rights, responsibilities, and visitor guidelines provided. Skin search WNL- with old lower lumbar surgical scar and upper arm bruise. Belongings search completed with no contraband found. He endorses anxiety and depression, denies SI/HI/AVH at this time. States yesterday when he walked by a piece of glass he said "I know what I could do with that", as he walked over a bridge he stated "this bridge is not high enough". He stated if he "wanted to do it, I would have done it".He was then oriented to his room and then the unit. No questions or concerns at present.He contracts for safety and remains safe on the unit at this time.

## 2022-11-17 NOTE — ED Notes (Signed)
Report given to Behavioral health nurse, Onalee Hua.

## 2022-11-17 NOTE — Tx Team (Signed)
Initial Treatment Plan 11/17/2022 5:25 PM Noah Avery OZH:[id number]    PATIENT STRESSORS: Financial difficulties   Loss of relationships and job   Marital or family conflict   Occupational concerns   Traumatic event     PATIENT STRENGTHS: Ability for insight  Average or above average intelligence  Capable of independent living  Forensic psychologist fund of knowledge  Motivation for treatment/growth  Religious Affiliation  Work skills    PATIENT IDENTIFIED PROBLEMS: Loss of Job and relationships  Homelessness  Financial problems  Unemployment  Anxiety and depression             DISCHARGE CRITERIA:  Ability to meet basic life and health needs Adequate post-discharge living arrangements Reduction of life-threatening or endangering symptoms to within safe limits Safe-care adequate arrangements made  PRELIMINARY DISCHARGE PLAN: Outpatient therapy Placement in alternative living arrangements  PATIENT/FAMILY INVOLVEMENT: This treatment plan has been presented to and reviewed with the patient, Noah Avery.  The patient and family have been given the opportunity to ask questions and make suggestions.  Delos Haring, RN 11/17/2022, 5:25 PM

## 2022-11-17 NOTE — Consult Note (Addendum)
[facility] Face-to-Face Psychiatry Consult   Reason for Consult:  suicidal ideation Referring Physician: Delton Prairie MD Patient Identification: Noah Avery MRN:  [id number] Principal Diagnosis: <principal problem not specified> Diagnosis:  Active Problems:   Suicidal ideation   Total Time spent with patient: 30 minutes  Subjective:   Noah Avery is a 52 y.o. male patient admitted with suicidal ideations. "I'm at the end of my rope". "My whole life is imploding around me for the past 6-7 months".  HPI:  Noah Avery is a 52 y.o. male patient admitted with suicidal ideations. He reports thoughts of self harm but denies a plan. Patient states "I need to know what's wrong with me chemically" He has a psychiatric hx of ADHD and MDD, being diagnosed 20 years ago. Patient reports that he was established with [facility], receiving outpatient services 20 years ago. He reports not taking any medications currently. He is unable to recall what medications he has taken in the past, except gabapentin of which he says was "weak". 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 Patient is A&O x 4, calm, pleasant, and willing to engage. Depressed mood. He is tearful at times. He is disheveled, malodorous and appears older than his stated age. He reports homelessness, stating that he lost his job due to having a warrant for unpaid child support. He has three children and one younger sister but is estranged from his family. Patient states that his father is living but "in the same predicament as me".  He denies a family hx of completed suicide or attempts. Denies psych family hx. Smokes 1 pack of cigarettes per day. Drinks 3-4 beers daily. Denies AVH, paranoia, HI. Past Psychiatric History: MDD  Risk to Self:  SI Risk to Others:  none Prior Inpatient Therapy:  N/A Prior Outpatient Therapy:   [facility] 20 years ago  Past Medical History:  Past Medical History:  Diagnosis Date   Depression    H/O degenerative disc disease     Past Surgical History:  Procedure Laterality Date   BACK SURGERY     Family History: No family history on file. Family Psychiatric  History: None Social History:  Social History   Substance and Sexual Activity  Alcohol Use Yes   Comment: occasional     Social History   Substance and Sexual Activity  Drug Use No    Social History   Socioeconomic History   Marital status: Divorced    Spouse name: Not on file   Number of children: Not on file   Years of education: Not on file   Highest education level: Not on file  Occupational History   Not on file  Tobacco Use   Smoking status: Every Day    Packs/day: 1    Types: Cigarettes   Smokeless tobacco: Never  Vaping Use   Vaping Use: Never used  Substance and Sexual Activity   Alcohol use: Yes    Comment: occasional   Drug use: No   Sexual activity: Not on file  Other Topics Concern   Not on file  Social History Narrative   Not on file   Social Determinants of Health   Financial Resource Strain: Not on file  Food Insecurity: Not on file  Transportation Needs: Not on file  Physical Activity: Not on file  Stress: Not on file  Social Connections: Not on file   Additional Social History:    Allergies:   Allergies  Allergen Reactions   Silver Nitrate     Labs:  Results for orders placed or performed during the hospital encounter of 11/17/22 (from the past 48 hour(s))  Comprehensive metabolic panel     Status: Abnormal   Collection Time: 11/16/22  6:32 PM  Result Value Ref Range   Sodium 134 (L) 135 - 145 mmol/L   Potassium 3.8 3.5 - 5.1 mmol/L   Chloride 98 98 - 111 mmol/L   CO2 25 22 - 32 mmol/L   Glucose, Bld 107 (H) 70 - 99 mg/dL    Comment: Glucose reference range applies only to samples taken after fasting for at least 8 hours.   BUN 13 6 - 20 mg/dL   Creatinine, Ser  1.61 0.61 - 1.24 mg/dL   Calcium 9.4 8.9 - 09.6 mg/dL   Total Protein 7.8 6.5 - 8.1 g/dL   Albumin 4.9 3.5 - 5.0 g/dL   AST 32 15 - 41 U/L   ALT 18 0 - 44 U/L   Alkaline Phosphatase 79 38 - 126 U/L   Total Bilirubin 0.6 0.3 - 1.2 mg/dL   GFR, Estimated >04 >54 mL/min    Comment: (NOTE) Calculated using the CKD-EPI Creatinine Equation (2021)    Anion gap 11 5 - 15    Comment: Performed at [facility], [address] [place], [place] [address]  Ethanol     Status: None   Collection Time: 11/16/22  6:32 PM  Result Value Ref Range   Alcohol, Ethyl (B) <10 <10 mg/dL    Comment: (NOTE) Lowest detectable limit for serum alcohol is 10 mg/dL.  For medical purposes only. Performed at [facility], [address] [place], [place] [address]   Salicylate level     Status: Abnormal   Collection Time: 11/16/22  6:32 PM  Result Value Ref Range   Salicylate Lvl <7.0 (L) 7.0 - 30.0 mg/dL    Comment: Performed at [facility], [address] [place], [place] [address]  Acetaminophen level     Status: Abnormal   Collection Time: 11/16/22  6:32 PM  Result Value Ref Range   Acetaminophen (Tylenol), Serum <10 (L) 10 - 30 ug/mL    Comment: (NOTE) Therapeutic concentrations vary significantly. A range of 10-30 ug/mL  may be an effective concentration for many patients. However, some  are best treated at concentrations outside of this range. Acetaminophen concentrations >150 ug/mL at 4 hours after ingestion  and >50 ug/mL at 12 hours after ingestion are often associated with  toxic reactions.  Performed at [facility], [address] [place], [place] [address]   cbc     Status: Abnormal   Collection Time: 11/16/22  6:32 PM  Result Value Ref Range   WBC 15.6 (H) 4.0 - 10.5 K/uL   RBC 4.62 4.22 - 5.81 MIL/uL   Hemoglobin 14.3 13.0 - 17.0 g/dL   HCT 57.8 46.9 - 62.9 %   MCV 94.8 80.0 - 100.0 fL   MCH 31.0 26.0 - 34.0 pg   MCHC 32.6 30.0 - 36.0  g/dL   RDW 52.8 41.3 - 24.4 %   Platelets 222 150 - 400 K/uL   nRBC 0.0 0.0 - 0.2 %    Comment: Performed at [facility], [address] [place], [place] [address]  Urine Drug Screen, Qualitative     Status: None  Collection Time: 11/16/22  6:32 PM  Result Value Ref Range   Tricyclic, Ur Screen NONE DETECTED NONE DETECTED   Amphetamines, Ur Screen NONE DETECTED NONE DETECTED   MDMA (Ecstasy)Ur Screen NONE DETECTED NONE DETECTED   Cocaine Metabolite,Ur [place] NONE DETECTED NONE DETECTED   Opiate, Ur Screen NONE DETECTED NONE DETECTED   Phencyclidine (PCP) Ur S NONE DETECTED NONE DETECTED   Cannabinoid 50 Ng, Ur [place] NONE DETECTED NONE DETECTED   Barbiturates, Ur Screen NONE DETECTED NONE DETECTED   Benzodiazepine, Ur Scrn NONE DETECTED NONE DETECTED   Methadone Scn, Ur NONE DETECTED NONE DETECTED    Comment: (NOTE) Tricyclics + metabolites, urine    Cutoff 1000 ng/mL Amphetamines + metabolites, urine  Cutoff 1000 ng/mL MDMA (Ecstasy), urine              Cutoff 500 ng/mL Cocaine Metabolite, urine          Cutoff 300 ng/mL Opiate + metabolites, urine        Cutoff 300 ng/mL Phencyclidine (PCP), urine         Cutoff 25 ng/mL Cannabinoid, urine                 Cutoff 50 ng/mL Barbiturates + metabolites, urine  Cutoff 200 ng/mL Benzodiazepine, urine              Cutoff 200 ng/mL Methadone, urine                   Cutoff 300 ng/mL  The urine drug screen provides only a preliminary, unconfirmed analytical test result and should not be used for non-medical purposes. Clinical consideration and professional judgment should be applied to any positive drug screen result due to possible interfering substances. A more specific alternate chemical method must be used in order to obtain a confirmed analytical result. Gas chromatography / mass spectrometry (GC/MS) is the preferred confirm atory method. Performed at [facility], [address] [place], [place] [address]      No current facility-administered medications for this encounter.   No current outpatient medications on file.   Facility-Administered Medications Ordered in Other Encounters  Medication Dose Route Frequency Provider Last Rate Last Admin   acetaminophen (TYLENOL) tablet 650 mg  650 mg Oral Q6H PRN Meridith Romick, NP       alum & mag hydroxide-simeth (MAALOX/MYLANTA) 200-200-20 MG/5ML suspension 30 mL  30 mL Oral Q4H PRN Hadriel Northup, NP       diphenhydrAMINE (BENADRYL) capsule 50 mg  50 mg Oral TID PRN Mcneil Sober, NP       Or   diphenhydrAMINE (BENADRYL) injection 50 mg  50 mg Intramuscular TID PRN Liliauna Santoni, NP       haloperidol (HALDOL) tablet 5 mg  5 mg Oral TID PRN Mcneil Sober, NP       Or   haloperidol lactate (HALDOL) injection 5 mg  5 mg Intramuscular TID PRN Mcneil Sober, NP       hydrOXYzine (ATARAX) tablet 25 mg  25 mg Oral TID PRN Kailea Dannemiller, [place] Neighbor, NP       LORazepam (ATIVAN) tablet 2 mg  2 mg Oral TID PRN Mcneil Sober, NP       Or   LORazepam (ATIVAN) injection 2 mg  2 mg Intramuscular TID PRN Anaelle Dunton, NP       magnesium hydroxide (MILK OF MAGNESIA) suspension 30 mL  30 mL Oral Daily PRN Mcneil Sober, NP       traZODone (DESYREL)  tablet 50 mg  50 mg Oral QHS PRN Mcneil Sober, NP        Musculoskeletal: Strength & Muscle Tone: within normal limits Gait & Station: normal Patient leans: N/A            Psychiatric Specialty Exam:  Presentation  General Appearance: No data recorded Eye Contact:No data recorded Speech:No data recorded Speech Volume:No data recorded Handedness:No data recorded  Mood and Affect  Mood:No data recorded Affect:No data recorded  Thought Process  Thought Processes:No data recorded Descriptions of Associations:No data recorded Orientation:No data recorded Thought Content:No data recorded History of Schizophrenia/Schizoaffective disorder:No  Duration of Psychotic Symptoms:No data recorded Hallucinations:No data  recorded Ideas of Reference:No data recorded Suicidal Thoughts:No data recorded Homicidal Thoughts:No data recorded  Sensorium  Memory:No data recorded Judgment:No data recorded Insight:No data recorded  Executive Functions  Concentration:No data recorded Attention Span:No data recorded Recall:No data recorded Fund of Knowledge:No data recorded Language:No data recorded  Psychomotor Activity  Psychomotor Activity:No data recorded  Assets  Assets:No data recorded  Sleep  Sleep:No data recorded  Physical Exam: Physical Exam Vitals and nursing note reviewed.  Neurological:     General: No focal deficit present.     Mental Status: He is alert and oriented to person, place, and time.  Psychiatric:        Behavior: Behavior normal.    Review of Systems  Psychiatric/Behavioral:  Positive for depression and suicidal ideas. Negative for hallucinations and memory loss. The patient is not nervous/anxious and does not have insomnia.   All other systems reviewed and are negative.  Blood pressure 115/84, pulse (!) 102, temperature 99.3 F (37.4 C), temperature source Oral, resp. rate 20, height 5\' 10"  (1.778 m), weight 77.1 kg, SpO2 96 %. Body mass index is 24.39 kg/m.  Treatment Plan Summary: Noah Avery is a 52 y.o. male patient admitted with suicidal ideations. Patient will require inpatient admission for safety and medication stabilization of symptoms due to current SI.  Disposition: Recommend psychiatric Inpatient admission when medically cleared.  Mcneil Sober, NP 11/17/2022 4:45 PM

## 2022-11-17 NOTE — Plan of Care (Signed)
  Problem: Education: Goal: Knowledge of General Education information will improve Description: Including pain rating scale, medication(s)/side effects and non-pharmacologic comfort measures Outcome: Not Progressing   Problem: Health Behavior/Discharge Planning: Goal: Ability to manage health-related needs will improve Outcome: Not Progressing   Problem: Clinical Measurements: Goal: Ability to maintain clinical measurements within normal limits will improve Outcome: Not Progressing Goal: Will remain free from infection Outcome: Not Progressing Goal: Diagnostic test results will improve Outcome: Not Progressing Goal: Respiratory complications will improve Outcome: Not Progressing Goal: Cardiovascular complication will be avoided Outcome: Not Progressing   Problem: Activity: Goal: Risk for activity intolerance will decrease Outcome: Not Progressing   Problem: Nutrition: Goal: Adequate nutrition will be maintained Outcome: Not Progressing   Problem: Coping: Goal: Level of anxiety will decrease Outcome: Not Progressing   Problem: Elimination: Goal: Will not experience complications related to bowel motility Outcome: Not Progressing Goal: Will not experience complications related to urinary retention Outcome: Not Progressing   Problem: Pain Managment: Goal: General experience of comfort will improve Outcome: Not Progressing   Problem: Safety: Goal: Ability to remain free from injury will improve Outcome: Not Progressing   Problem: Skin Integrity: Goal: Risk for impaired skin integrity will decrease Outcome: Not Progressing   Problem: Education: Goal: Utilization of techniques to improve thought processes will improve Outcome: Not Progressing Goal: Knowledge of the prescribed therapeutic regimen will improve Outcome: Not Progressing   Problem: Activity: Goal: Interest or engagement in leisure activities will improve Outcome: Not Progressing Goal: Imbalance in  normal sleep/wake cycle will improve Outcome: Not Progressing   Problem: Coping: Goal: Coping ability will improve Outcome: Not Progressing Goal: Will verbalize feelings Outcome: Not Progressing   Problem: Health Behavior/Discharge Planning: Goal: Ability to make decisions will improve Outcome: Not Progressing Goal: Compliance with therapeutic regimen will improve Outcome: Not Progressing   Problem: Role Relationship: Goal: Will demonstrate positive changes in social behaviors and relationships Outcome: Not Progressing   Problem: Safety: Goal: Ability to disclose and discuss suicidal ideas will improve Outcome: Not Progressing Goal: Ability to identify and utilize support systems that promote safety will improve Outcome: Not Progressing   Problem: Self-Concept: Goal: Will verbalize positive feelings about self Outcome: Not Progressing Goal: Level of anxiety will decrease Outcome: Not Progressing   Problem: Education: Goal: Ability to state activities that reduce stress will improve Outcome: Not Progressing   Problem: Coping: Goal: Ability to identify and develop effective coping behavior will improve Outcome: Not Progressing   Problem: Self-Concept: Goal: Ability to identify factors that promote anxiety will improve Outcome: Not Progressing Goal: Level of anxiety will decrease Outcome: Not Progressing Goal: Ability to modify response to factors that promote anxiety will improve Outcome: Not Progressing

## 2022-11-17 NOTE — ED Provider Notes (Signed)
[facility] Provider Note    Event Date/Time   First MD Initiated Contact with Patient 11/17/22 0259     (approximate)   History   Suicidal   HPI  Noah Avery is a 52 y.o. male who presents to the ED for evaluation of Suicidal   Patient presents to the ED for evaluation of increasing depression, intrusive thoughts and suicidal thoughts.  No clear plan.  He reports chronic and progressively worsening psychosocial stressors.  He is now homeless but denies any recreational drug use.  No clear plans.  He reports persistent thoughts of wanting to harm himself and that it is "actually an option now" and so he walks here for assistance.   Physical Exam   Triage Vital Signs: ED Triage Vitals [11/16/22 1831]  Enc Vitals Group     BP (!) 143/96     Pulse Rate (!) 109     Resp 18     Temp 99.1 F (37.3 C)     Temp Source Oral     SpO2 96 %     Weight 170 lb (77.1 kg)     Height 5\' 10"  (1.778 m)     Head Circumference      Peak Flow      Pain Score      Pain Loc      Pain Edu?      Excl. in GC?     Most recent vital signs: Vitals:   11/16/22 1831  BP: (!) 143/96  Pulse: (!) 109  Resp: 18  Temp: 99.1 F (37.3 C)  SpO2: 96%    General: Awake, no distress.  CV:  Good peripheral perfusion.  Resp:  Normal effort.  Abd:  No distention.  MSK:  No deformity noted.  Neuro:  No focal deficits appreciated. Other:     ED Results / Procedures / Treatments   Labs (all labs ordered are listed, but only abnormal results are displayed) Labs Reviewed  COMPREHENSIVE METABOLIC PANEL - Abnormal; Notable for the following components:      Result Value   Sodium 134 (*)    Glucose, Bld 107 (*)    All other components within normal limits  SALICYLATE LEVEL - Abnormal; Notable for the following components:   Salicylate Lvl <7.0 (*)    All other components within normal limits  ACETAMINOPHEN LEVEL - Abnormal; Notable for the following components:    Acetaminophen (Tylenol), Serum <10 (*)    All other components within normal limits  CBC - Abnormal; Notable for the following components:   WBC 15.6 (*)    All other components within normal limits  ETHANOL  URINE DRUG SCREEN, QUALITATIVE ([facility] ONLY)    EKG   RADIOLOGY   Official radiology report(s): No results found.  PROCEDURES and INTERVENTIONS:  Procedures  Medications - No data to display   IMPRESSION / MDM / ASSESSMENT AND PLAN / ED COURSE  I reviewed the triage vital signs and the nursing notes.  Differential diagnosis includes, but is not limited to, secondary gain or malingering, polysubstance abuse, suicidal,  {Patient presents with symptoms of an acute illness or injury that is potentially life-threatening.  Patient presents with worsening depression and suicidal thoughts requiring psychiatric evaluation.  No evidence of medical pathology to preclude psychiatric disposition.  Nonspecific leukocytosis is noted but no preceding fevers or signs of infectious etiology.  Essentially normal metabolic panel.  Clear UDS.  He is willing to be here and I  urged him to stay and we will keep him voluntary for now.  We will consult psychiatry.  May benefit from admission     FINAL CLINICAL IMPRESSION(S) / ED DIAGNOSES   Final diagnoses:  Suicidal thoughts     Rx / DC Orders   ED Discharge Orders     None        Note:  This document was prepared using Dragon voice recognition software and may include unintentional dictation errors.   Delton Prairie, MD 11/17/22 [phone]

## 2022-11-17 NOTE — ED Notes (Signed)
Pt was awaken and sitting up eating lunch at this time. Denies any needs.

## 2022-11-17 NOTE — Progress Notes (Signed)
Patient calm and pleasant during assessment denying SI/HI/AVH. Pt endorses anxiety and depression. Pt observed interacting appropriately with staff and peers on the unit. Pt didn't have any medications scheduled tonight and hasn't requested anything PRN. Pt being monitored Q 15 minutes for safety pre unit protocol, remains safe on the unit

## 2022-11-17 NOTE — BH Assessment (Addendum)
Comprehensive Clinical Assessment (CCA) Note  11/17/2022 Noah Avery [id number]  Chief Complaint: Patient is a 52 year old male presenting to [facility] ED voluntarily. Per triage note Pt sts that he has been having thought of SI for the last six or seven months. Pt denies any attempt or plan. Pt is homeless at this time. Pt sts that the landlord was able to help him out for the last six months until two weeks ago, when pt moved out of his residence. During assessment patient appears alert and oriented x4, calm and cooperative, mood is depressed. Patient reports "I'm not in a good place" he reports SI and reports "I'm coming real close to hurting myself." Patient denies any past attempts and has no current plan. Patient reports no current medications and no current outpatient provider, he reports being treated by outpatient facility [facility] in the past and taking anti-depressants, he reports that he has not taken them in 2 years. Patient also reports being homeless with no plans on where to live next. Patient denies HI/AH/VH  Per Psyc NP Sindy Guadeloupe patient to be reassessed Chief Complaint  Patient presents with   Suicidal   Visit Diagnosis: Major Depressive Disorder, recurrent episode, severe    CCA Screening, Triage and Referral (STR)  Patient Reported Information How did you hear about [place]? Self  Referral name: No data recorded Referral phone number: No data recorded  Whom do you see for routine medical problems? No data recorded Practice/Facility Name: No data recorded Practice/Facility Phone Number: No data recorded Name of Contact: No data recorded Contact Number: No data recorded Contact Fax Number: No data recorded Prescriber Name: No data recorded Prescriber Address (if known): No data recorded  What Is the Reason for Your Visit/Call Today? Pt sts that he has been having thought of SI for the last six or seven months. Pt denies any attempt or plan. Pt is homeless at this time. Pt sts  that the landlord was able to help him out for the last six months until two weeks ago, when pt moved out of his residence.  How Long Has This Been Causing You Problems? > than 6 months  What Do You Feel Would Help You the Most Today? Treatment for Depression or other mood problem   Have You Recently Been in Any Inpatient Treatment (Hospital/Detox/Crisis Center/28-Day Program)? No data recorded Name/Location of Program/Hospital:No data recorded How Long Were You There? No data recorded When Were You Discharged? No data recorded  Have You Ever Received Services From [facility] Before? No data recorded Who Do You See at [facility]? No data recorded  Have You Recently Had Any Thoughts About Hurting Yourself? Yes  Are You Planning to Commit Suicide/Harm Yourself At This time? No   Have you Recently Had Thoughts About Hurting Someone [name]? No  Explanation: No data recorded  Have You Used Any Alcohol or Drugs in the Past 24 Hours? No  How Long Ago Did You Use Drugs or Alcohol? No data recorded What Did You Use and How Much? No data recorded  Do You Currently Have a Therapist/Psychiatrist? No  Name of Therapist/Psychiatrist: No data recorded  Have You Been Recently Discharged From Any Office Practice or Programs? No  Explanation of Discharge From Practice/Program: No data recorded    CCA Screening Triage Referral Assessment Type of Contact: Face-to-Face  Is this Initial or Reassessment? No data recorded Date Telepsych consult ordered in [facility]:  No data recorded Time Telepsych consult ordered in [facility]:  No data recorded  Patient Reported Information Reviewed? No data recorded Patient Left Without Being Seen? No data recorded Reason for Not Completing Assessment: No data recorded  Collateral Involvement: No data recorded  Does Patient Have a Court Appointed Legal Guardian? No data recorded Name and Contact of Legal Guardian: No data recorded If Minor and Not Living with  Parent(s), Who has Custody? No data recorded Is CPS involved or ever been involved? Never  Is APS involved or ever been involved? Never   Patient Determined To Be At Risk for Harm To Self or Others Based on Review of Patient Reported Information or Presenting Complaint? No  Method: No data recorded Availability of Means: No data recorded Intent: No data recorded Notification Required: No data recorded Additional Information for Danger to Others Potential: No data recorded Additional Comments for Danger to Others Potential: No data recorded Are There Guns or Other Weapons in Your Home? No  Types of Guns/Weapons: No data recorded Are These Weapons Safely Secured?                            No data recorded Who Could Verify You Are Able To Have These Secured: No data recorded Do You Have any Outstanding Charges, Pending Court Dates, Parole/Probation? No data recorded Contacted To Inform of Risk of Harm To Self or Others: No data recorded  Location of Assessment: [facility] ED   Does Patient Present under Involuntary Commitment? No  IVC Papers Initial File Date: No data recorded  [place] of Residence: [place]   Patient Currently Receiving the Following Services: No data recorded  Determination of Need: Emergent (2 hours)   Options For Referral: No data recorded    CCA Biopsychosocial Intake/Chief Complaint:  No data recorded Current Symptoms/Problems: No data recorded  Patient Reported Schizophrenia/Schizoaffective Diagnosis in Past: No   Strengths: Patient is able to communicate his needs  Preferences: No data recorded Abilities: No data recorded  Type of Services Patient Feels are Needed: No data recorded  Initial Clinical Notes/Concerns: No data recorded  Mental Health Symptoms Depression:   Change in energy/activity; Difficulty Concentrating; Fatigue; Hopelessness; Worthlessness   Duration of Depressive symptoms:  Greater than two weeks   Mania:   None    Anxiety:    Difficulty concentrating; Fatigue; Restlessness; Worrying   Psychosis:   None   Duration of Psychotic symptoms: No data recorded  Trauma:   None   Obsessions:   None   Compulsions:   None   Inattention:   None   Hyperactivity/Impulsivity:   None   Oppositional/Defiant Behaviors:   None   Emotional Irregularity:   None   Other Mood/Personality Symptoms:  No data recorded   Mental Status Exam Appearance and self-care  Stature:   Average   Weight:   Average weight   Clothing:   Casual   Grooming:   Normal   Cosmetic use:   None   Posture/gait:   Normal   Motor activity:   Not Remarkable   Sensorium  Attention:   Normal   Concentration:   Normal   Orientation:   X5   Recall/memory:   Normal   Affect and Mood  Affect:   Appropriate   Mood:   Depressed   Relating  Eye contact:   Normal   Facial expression:   Responsive   Attitude toward examiner:   Cooperative   Thought and Language  Speech flow:  Clear and Coherent  Thought content:   Appropriate to Mood and Circumstances   Preoccupation:   None   Hallucinations:   None   Organization:  No data recorded  [employer] of Knowledge:   Fair   Intelligence:   Average   Abstraction:   Normal   Judgement:   Good   Reality Testing:   Adequate   Insight:   Good   Decision Making:   Normal   Social Functioning  Social Maturity:   Responsible   Social Judgement:   Normal   Stress  Stressors:   Housing; Office manager Ability:   Exhausted   Skill Deficits:   None   Supports:   Support needed     Religion: Religion/Spirituality Are You A Religious Person?: No  Leisure/Recreation: Leisure / Recreation Do You Have Hobbies?: No  Exercise/Diet: Exercise/Diet Do You Exercise?: No Have You Gained or Lost A Significant Amount of Weight in the Past Six Months?: No Do You Follow a Special Diet?: No Do You  Have Any Trouble Sleeping?: No   CCA Employment/Education Employment/Work Situation: Employment / Work Situation Employment Situation: Unemployed Patient's Job has Been Impacted by Current Illness: No Has Patient ever Been in Equities trader?: No  Education: Education Is Patient Currently Attending School?: No Did You Have An Individualized Education Program (IIEP): No Did You Have Any Difficulty At [employer]?: No Patient's Education Has Been Impacted by Current Illness: No   CCA Family/Childhood History Family and Relationship History: Family history Marital status: Single Does patient have children?:  (Unknown)  Childhood History:  Childhood History Did patient suffer any verbal/emotional/physical/sexual abuse as a child?: No Did patient suffer from severe childhood neglect?: No Has patient ever been sexually abused/assaulted/raped as an adolescent or adult?: No Was the patient ever a victim of a crime or a disaster?: No Witnessed domestic violence?: No Has patient been affected by domestic violence as an adult?: No  Child/Adolescent Assessment:     CCA Substance Use Alcohol/Drug Use: Alcohol / Drug Use Pain Medications: see mar Prescriptions: see mar Over the Counter: see mar History of alcohol / drug use?: No history of alcohol / drug abuse                         ASAM's:  Six Dimensions of Multidimensional Assessment  Dimension 1:  Acute Intoxication and/or Withdrawal Potential:      Dimension 2:  Biomedical Conditions and Complications:      Dimension 3:  Emotional, Behavioral, or Cognitive Conditions and Complications:     Dimension 4:  Readiness to Change:     Dimension 5:  Relapse, Continued use, or Continued Problem Potential:     Dimension 6:  Recovery/Living Environment:     ASAM Severity Score:    ASAM Recommended Level of Treatment:     Substance use Disorder (SUD)    Recommendations for Services/Supports/Treatments:    DSM5  Diagnoses: Patient Active Problem List   Diagnosis Date Noted   Hyperkalemia 02/11/2022   DDD (degenerative disc disease), lumbar 02/11/2022   AKI (acute kidney injury) (HCC) 02/11/2022   Hyponatremia 02/11/2022   Major depressive disorder, recurrent severe without psychotic features (HCC) 01/04/2016   Tobacco use disorder 01/04/2016   CONSTIPATION 04/22/2010   ABDOMINAL PAIN 04/22/2010   LUMBAR RADICULOPATHY 08/05/2009   HYPERLIPIDEMIA 10/25/2008   DEPRESSION 10/25/2008   MIGRAINE, COMMON 10/25/2008   HTN (hypertension) 10/25/2008   HEMORRHOIDS 10/25/2008   DENTAL CARIES 10/25/2008  BLOOD IN STOOL 10/25/2008   BACK PAIN 10/25/2008   CARDIAC MURMUR 10/25/2008    Patient Centered Plan: Patient is on the following Treatment Plan(s):  Depression   Referrals to Alternative Service(s): Referred to Alternative Service(s):   Place:   Date:   Time:    Referred to Alternative Service(s):   Place:   Date:   Time:    Referred to Alternative Service(s):   Place:   Date:   Time:    Referred to Alternative Service(s):   Place:   Date:   Time:      @[facility] @  [employer], LCAS-A

## 2022-11-18 DIAGNOSIS — F332 Major depressive disorder, recurrent severe without psychotic features: Secondary | ICD-10-CM | POA: Diagnosis not present

## 2022-11-18 MED ORDER — FLUOXETINE HCL 20 MG PO CAPS
20.0000 mg | ORAL_CAPSULE | Freq: Every day | ORAL | Status: DC
  Administered 2022-11-18 – 2022-12-01 (×14): 20 mg via ORAL
  Filled 2022-11-18 (×15): qty 1

## 2022-11-18 MED ORDER — NICOTINE POLACRILEX 2 MG MT GUM: 2.0000 mg | CHEWING_GUM | OROMUCOSAL | Status: DC | PRN

## 2022-11-18 MED ORDER — NICOTINE 21 MG/24HR TD PT24
21.0000 mg | MEDICATED_PATCH | Freq: Every day | TRANSDERMAL | Status: DC
  Administered 2022-11-18 – 2022-12-01 (×14): 21 mg via TRANSDERMAL
  Filled 2022-11-18 (×14): qty 1

## 2022-11-18 NOTE — Group Note (Signed)
Date:  11/18/2022 Time:  9:00 AM  Group Topic/Focus:  Goals Group:   The focus of this group is to help patients establish daily goals to achieve during treatment and discuss how the patient can incorporate goal setting into their daily lives to aide in recovery.  Community Group    Participation Level:  Did Not Attend  Noah Avery A Noah Avery 11/18/2022, 6:30 PM  

## 2022-11-18 NOTE — Group Note (Signed)
Recreation Therapy Group Note   Group Topic:Healthy Support Systems  Group Date: 11/18/2022 Start Time: 1000 End Time: 1100 Facilitators: Rosina Lowenstein, LRT, CTRS Location:  Craft Room  Group Description: Straw Bridge. Patients were given 10 plastic drinking straws and an equal length of masking tape. Using the materials provided, in pairs patients were instructed to build a free-standing bridge-like structure to suspend an everyday item (ex: deck of cards) off the floor or table surface. All materials were required to be used in Secondary school teacher. LRT facilitated post-activity discussion reviewing the importance of having strong and healthy support systems in our lives. LRT discussed how the people in our lives serve as the tape and the book we placed on top of our straw structure are the stressors we face in daily life. LRT and pts discussed what happens in our life when things get too heavy for [place], and we don't have strong supports outside of the hospital. Pt shared 2 of their healthy supports aloud in the group.   Goal Area(s) Addressed:  Patient will identify 2 healthy supports in their life. Patient will identify skills to successfully complete activity. Patient will identify correlation of this activity to life post-discharge.   Affect/Mood: Appropriate   Participation Level: Active and Engaged   Participation Quality: Independent   Behavior: Appropriate, Calm, and Cooperative   Speech/Thought Process: Coherent   Insight: Good   Judgement: Good   Modes of Intervention: Activity   Patient Response to Interventions:  Attentive, Engaged, Interested , and Receptive   Education Outcome:  Acknowledges education   Clinical Observations/Individualized Feedback: Noah Avery was active in their participation of session activities and group discussion. Pt identified "[employer] [place] [employer] [place]" as his healthy support system. Pt successfully completed activity of building a structure with all  given materials. Pt spontaneously contributed to group discussion. Pt interacted well with LRT and peers duration of session.   Plan: Continue to engage patient in RT group sessions 2-3x/week.   Rosina Lowenstein, LRT, CTRS 11/18/2022 11:54 AM

## 2022-11-18 NOTE — H&P (Signed)
Psychiatric Admission Assessment Adult  Patient Identification: Noah Avery MRN:  [id number] Date of Evaluation:  11/18/2022 Chief Complaint:  Suicidal ideation [R45.851] Principal Diagnosis: Major depressive disorder, recurrent severe without psychotic features (HCC) Diagnosis:  Principal Problem:   Major depressive disorder, recurrent severe without psychotic features (HCC) Active Problems:   Suicidal ideation  History of Present Illness: Patient seen and chart reviewed.  52 year old man with a longstanding past history of depression and has come to the emergency room stating suicidal ideation in the context of being homeless.  Patient reports that he has had multiple major losses this year.  He lost jobs more than once and is not currently working, does not have a place to live, lost his vehicle.  He says he has no family no one he can rely on or get help from.  His mood has become more hopeless and depressed and he endorses suicidal thoughts without a specific plan or intent.  No psychotic symptoms.  He acknowledges that he is drinking every day but minimizes its effect on him.  Denies other drug use.  Not currently getting any mental health treatment. Associated Signs/Symptoms: Depression Symptoms:  depressed mood, anhedonia, (Hypo) Manic Symptoms:  Impulsivity, Anxiety Symptoms:  Excessive Worry, Psychotic Symptoms:   None PTSD Symptoms: Negative Total Time spent with patient: 45 minutes  Past Psychiatric History: Patient has a history of depression.  Back in 2017 received some treatment here.  Had been on Remeron at that time.  Patient cannot recall what medicines had ever been helpful for him although he mentions gabapentin several times.  Denies past suicide attempts.  Denies psychosis.  Is the patient at risk to self? Yes.    Has the patient been a risk to self in the past 6 months? No.  Has the patient been a risk to self within the distant past? Yes.    Is the patient a risk  to others? No.  Has the patient been a risk to others in the past 6 months? No.  Has the patient been a risk to others within the distant past? No.   [place] Scale:  Flowsheet Row Admission (Current) from 11/17/2022 in [facility] INPATIENT BEHAVIORAL MEDICINE Most recent reading at 11/17/2022  4:40 PM ED from 11/17/2022 in [facility] Emergency Department at [facility] Most recent reading at 11/16/2022  6:40 PM ED from 02/11/2022 in [facility] Emergency Department at [facility] Most recent reading at 02/11/2022  6:36 PM  C-SSRS RISK CATEGORY No Risk High Risk No Risk        Prior Inpatient Therapy: Yes.   If yes, describe briefly years ago Prior Outpatient Therapy: Yes.   If yes, describe again briefly years ago  Alcohol Screening: Patient refused Alcohol Screening Tool: Yes 1. How often do you have a drink containing alcohol?: Never 2. How many drinks containing alcohol do you have on a typical day when you are drinking?: 1 or 2 3. How often do you have six or more drinks on one occasion?: Never AUDIT-C Score: 0 Alcohol Brief Interventions/Follow-up: Alcohol education/Brief advice Substance Abuse History in the last 12 months:  Yes.   Consequences of Substance Abuse: I think it remains an open issue whether the alcohol is an active problem.  He is certainly minimizing it but it cannot be good for him in his condition. Previous Psychotropic Medications: Yes  Psychological Evaluations: Yes  Past Medical History:  Past Medical History:  Diagnosis Date   Depression    H/O  degenerative disc disease     Past Surgical History:  Procedure Laterality Date   BACK SURGERY     Family History: History reviewed. No pertinent family history. Family Psychiatric  History: None reported Tobacco Screening:  Social History   Tobacco Use  Smoking Status Every Day   Packs/day: 1.00   Years: 15.00   Additional pack years: 0.00   Total pack years: 15.00   Types: Cigarettes   Passive  exposure: Current  Smokeless Tobacco Never    BH Tobacco Counseling     Are you interested in Tobacco Cessation Medications?  No value filed. Counseled patient on smoking cessation:  No value filed. Reason Tobacco Screening Not Completed: No value filed.       Social History:  Social History   Substance and Sexual Activity  Alcohol Use Yes   Comment: occasional     Social History   Substance and Sexual Activity  Drug Use No    Additional Social History:                           Allergies:   Allergies  Allergen Reactions   Silver Nitrate    Lab Results:  Results for orders placed or performed during the hospital encounter of 11/17/22 (from the past 48 hour(s))  Comprehensive metabolic panel     Status: Abnormal   Collection Time: 11/16/22  6:32 PM  Result Value Ref Range   Sodium 134 (L) 135 - 145 mmol/L   Potassium 3.8 3.5 - 5.1 mmol/L   Chloride 98 98 - 111 mmol/L   CO2 25 22 - 32 mmol/L   Glucose, Bld 107 (H) 70 - 99 mg/dL    Comment: Glucose reference range applies only to samples taken after fasting for at least 8 hours.   BUN 13 6 - 20 mg/dL   Creatinine, Ser 1.61 0.61 - 1.24 mg/dL   Calcium 9.4 8.9 - 09.6 mg/dL   Total Protein 7.8 6.5 - 8.1 g/dL   Albumin 4.9 3.5 - 5.0 g/dL   AST 32 15 - 41 U/L   ALT 18 0 - 44 U/L   Alkaline Phosphatase 79 38 - 126 U/L   Total Bilirubin 0.6 0.3 - 1.2 mg/dL   GFR, Estimated >04 >54 mL/min    Comment: (NOTE) Calculated using the CKD-EPI Creatinine Equation (2021)    Anion gap 11 5 - 15    Comment: Performed at [facility], [address] [place], [place] [address]  Ethanol     Status: None   Collection Time: 11/16/22  6:32 PM  Result Value Ref Range   Alcohol, Ethyl (B) <10 <10 mg/dL    Comment: (NOTE) Lowest detectable limit for serum alcohol is 10 mg/dL.  For medical purposes only. Performed at [facility], [address] [place], [place] [address]   Salicylate level      Status: Abnormal   Collection Time: 11/16/22  6:32 PM  Result Value Ref Range   Salicylate Lvl <7.0 (L) 7.0 - 30.0 mg/dL    Comment: Performed at [facility], [address] [place], [place] [address]  Acetaminophen level     Status: Abnormal   Collection Time: 11/16/22  6:32 PM  Result Value Ref Range   Acetaminophen (Tylenol), Serum <10 (L) 10 - 30 ug/mL    Comment: (NOTE) Therapeutic concentrations vary significantly. A range of 10-30 ug/mL  may be an effective concentration for many patients.  However, some  are best treated at concentrations outside of this range. Acetaminophen concentrations >150 ug/mL at 4 hours after ingestion  and >50 ug/mL at 12 hours after ingestion are often associated with  toxic reactions.  Performed at [facility], [address] [place], [place] [address]   cbc     Status: Abnormal   Collection Time: 11/16/22  6:32 PM  Result Value Ref Range   WBC 15.6 (H) 4.0 - 10.5 K/uL   RBC 4.62 4.22 - 5.81 MIL/uL   Hemoglobin 14.3 13.0 - 17.0 g/dL   HCT 60.4 54.0 - 98.1 %   MCV 94.8 80.0 - 100.0 fL   MCH 31.0 26.0 - 34.0 pg   MCHC 32.6 30.0 - 36.0 g/dL   RDW 19.1 47.8 - 29.5 %   Platelets 222 150 - 400 K/uL   nRBC 0.0 0.0 - 0.2 %    Comment: Performed at [facility], [address], [place], [place] [address]  Urine Drug Screen, Qualitative     Status: None   Collection Time: 11/16/22  6:32 PM  Result Value Ref Range   Tricyclic, Ur Screen NONE DETECTED NONE DETECTED   Amphetamines, Ur Screen NONE DETECTED NONE DETECTED   MDMA (Ecstasy)Ur Screen NONE DETECTED NONE DETECTED   Cocaine Metabolite,Ur [place] NONE DETECTED NONE DETECTED   Opiate, Ur Screen NONE DETECTED NONE DETECTED   Phencyclidine (PCP) Ur S NONE DETECTED NONE DETECTED   Cannabinoid 50 Ng, Ur [place] NONE DETECTED NONE DETECTED   Barbiturates, Ur Screen NONE DETECTED NONE DETECTED   Benzodiazepine, Ur Scrn NONE DETECTED NONE DETECTED   Methadone Scn, Ur NONE  DETECTED NONE DETECTED    Comment: (NOTE) Tricyclics + metabolites, urine    Cutoff 1000 ng/mL Amphetamines + metabolites, urine  Cutoff 1000 ng/mL MDMA (Ecstasy), urine              Cutoff 500 ng/mL Cocaine Metabolite, urine          Cutoff 300 ng/mL Opiate + metabolites, urine        Cutoff 300 ng/mL Phencyclidine (PCP), urine         Cutoff 25 ng/mL Cannabinoid, urine                 Cutoff 50 ng/mL Barbiturates + metabolites, urine  Cutoff 200 ng/mL Benzodiazepine, urine              Cutoff 200 ng/mL Methadone, urine                   Cutoff 300 ng/mL  The urine drug screen provides only a preliminary, unconfirmed analytical test result and should not be used for non-medical purposes. Clinical consideration and professional judgment should be applied to any positive drug screen result due to possible interfering substances. A more specific alternate chemical method must be used in order to obtain a confirmed analytical result. Gas chromatography / mass spectrometry (GC/MS) is the preferred confirm atory method. Performed at [facility], [address] [place], [place] [address]     Blood Alcohol level:  Lab Results  Component Value Date   [facility] <10 11/16/2022   ETH <5 01/04/2016    Metabolic Disorder Labs:  Lab Results  Component Value Date   HGBA1C 5.3 02/11/2022   MPG 105.41 02/11/2022   No results found for: "PROLACTIN" Lab Results  Component Value Date   CHOL 155 11/15/2008   TRIG 155.0 (H) 11/15/2008   HDL 31.00 (L) 11/15/2008  CHOLHDL 5 11/15/2008   VLDL 31.0 11/15/2008   LDLCALC 93 11/15/2008    Current Medications: Current Facility-Administered Medications  Medication Dose Route Frequency Provider Last Rate Last Admin   acetaminophen (TYLENOL) tablet 650 mg  650 mg Oral Q6H PRN Mcneil Sober, NP   650 mg at 11/18/22 1610   alum & mag hydroxide-simeth (MAALOX/MYLANTA) 200-200-20 MG/5ML suspension 30 mL  30 mL Oral Q4H PRN Penn, Cicely, NP        diphenhydrAMINE (BENADRYL) capsule 50 mg  50 mg Oral TID PRN Mcneil Sober, NP       Or   diphenhydrAMINE (BENADRYL) injection 50 mg  50 mg Intramuscular TID PRN Penn, Cranston Neighbor, NP       FLUoxetine (PROZAC) capsule 20 mg  20 mg Oral Daily Jermia Rigsby T, MD       haloperidol (HALDOL) tablet 5 mg  5 mg Oral TID PRN Mcneil Sober, NP       Or   haloperidol lactate (HALDOL) injection 5 mg  5 mg Intramuscular TID PRN Mcneil Sober, NP       hydrOXYzine (ATARAX) tablet 25 mg  25 mg Oral TID PRN Mcneil Sober, NP   25 mg at 11/18/22 9604   LORazepam (ATIVAN) tablet 2 mg  2 mg Oral TID PRN Mcneil Sober, NP       Or   LORazepam (ATIVAN) injection 2 mg  2 mg Intramuscular TID PRN Penn, Cranston Neighbor, NP       magnesium hydroxide (MILK OF MAGNESIA) suspension 30 mL  30 mL Oral Daily PRN Penn, Cicely, NP       nicotine (NICODERM CQ - dosed in mg/24 hours) patch 21 mg  21 mg Transdermal Daily Sukhman Martine T, MD       nicotine polacrilex (NICORETTE) gum 2 mg  2 mg Oral PRN Atira Borello, Jackquline Denmark, MD       traZODone (DESYREL) tablet 50 mg  50 mg Oral QHS PRN Penn, Cicely, NP       PTA Medications: No medications prior to admission.    Musculoskeletal: Strength & Muscle Tone: within normal limits Gait & Station: normal Patient leans: N/A            Psychiatric Specialty Exam:  Presentation  General Appearance: No data recorded Eye Contact:No data recorded Speech:No data recorded Speech Volume:No data recorded Handedness:No data recorded  Mood and Affect  Mood:No data recorded Affect:No data recorded  Thought Process  Thought Processes:No data recorded Duration of Psychotic Symptoms:N/A Past Diagnosis of Schizophrenia or Psychoactive disorder: No  Descriptions of Associations:No data recorded Orientation:No data recorded Thought Content:No data recorded Hallucinations:No data recorded Ideas of Reference:No data recorded Suicidal Thoughts:No data recorded Homicidal Thoughts:No data  recorded  Sensorium  Memory:No data recorded Judgment:No data recorded Insight:No data recorded  Executive Functions  Concentration:No data recorded Attention Span:No data recorded Recall:No data recorded Fund of Knowledge:No data recorded Language:No data recorded  Psychomotor Activity  Psychomotor Activity:No data recorded  Assets  Assets:No data recorded  Sleep  Sleep:No data recorded   Physical Exam: Physical Exam Vitals and nursing note reviewed.  Constitutional:      Appearance: Normal appearance.  HENT:     Head: Normocephalic and atraumatic.     Mouth/Throat:     Pharynx: Oropharynx is clear.  Eyes:     Pupils: Pupils are equal, round, and reactive to light.  Cardiovascular:     Rate and Rhythm: Normal rate and regular rhythm.  Pulmonary:  Effort: Pulmonary effort is normal.     Breath sounds: Normal breath sounds.  Abdominal:     General: Abdomen is flat.     Palpations: Abdomen is soft.  Musculoskeletal:        General: Normal range of motion.  Skin:    General: Skin is warm and dry.  Neurological:     General: No focal deficit present.     Mental Status: He is alert. Mental status is at baseline.  Psychiatric:        Attention and Perception: Attention normal.        Mood and Affect: Mood is depressed.        Speech: Speech normal.        Behavior: Behavior normal.        Thought Content: Thought content includes suicidal ideation. Thought content does not include suicidal plan.        Cognition and Memory: Cognition normal.        Judgment: Judgment normal.    Review of Systems  Constitutional: Negative.   HENT: Negative.    Eyes: Negative.   Respiratory: Negative.    Cardiovascular: Negative.   Gastrointestinal: Negative.   Musculoskeletal: Negative.   Skin: Negative.   Neurological: Negative.   Psychiatric/Behavioral:  Positive for depression and suicidal ideas. Negative for hallucinations, memory loss and substance abuse. The  patient is not nervous/anxious and does not have insomnia.    Blood pressure 131/86, pulse 90, temperature 98.7 F (37.1 C), temperature source Oral, resp. rate (!) 21, height 5\' 10"  (1.778 m), weight 71.7 kg, SpO2 96 %. Body mass index is 22.67 kg/m.  Treatment Plan Summary: Medication management and Plan discussed use of medication for depression.  Patient was open to restarting medicine.  Start fluoxetine 20 mg a day.  Does not appear to be having alcohol withdrawal symptoms not requiring detox..  Continue 15-minute checks.  Engage in individual and group therapy.  Patient needs to talk with social work about any options that are available in terms of a place to stay.  No other change to plan for today.  Include in group attendance and activity.  Observation Level/Precautions:  15 minute checks  Laboratory:  Chemistry Profile  Psychotherapy:    Medications:    Consultations:    Discharge Concerns:    Estimated LOS:  Other:     Physician Treatment Plan for Primary Diagnosis: Major depressive disorder, recurrent severe without psychotic features (HCC) Long Term Goal(s): Improvement in symptoms so as ready for discharge  Short Term Goals: Ability to verbalize feelings will improve and Ability to disclose and discuss suicidal ideas  Physician Treatment Plan for Secondary Diagnosis: Principal Problem:   Major depressive disorder, recurrent severe without psychotic features (HCC) Active Problems:   Suicidal ideation  Long Term Goal(s): Improvement in symptoms so as ready for discharge  Short Term Goals: Compliance with prescribed medications will improve  I certify that inpatient services furnished can reasonably be expected to improve the patient's condition.    Mordecai Rasmussen, MD 5/22/20242:33 PM

## 2022-11-18 NOTE — Progress Notes (Signed)
Patient calm and pleasant during assessment denying SI/HI/AVH. Pt endorses anxiety and depression. Pt observed interacting appropriately with staff and peers on the unit. Pt requested PRN sleep medication, check MAR. Pt given education, support, and encouragement to be active in his treatment plan. Pt being monitored Q 15 minutes for safety pre unit protocol, remains safe on the unit  

## 2022-11-18 NOTE — Plan of Care (Signed)

## 2022-11-18 NOTE — Group Note (Unsigned)
Date:  11/18/2022 Time:  6:22 PM  Group Topic/Focus:  Goals Group:   The focus of this group is to help patients establish daily goals to achieve during treatment and discuss how the patient can incorporate goal setting into their daily lives to aide in recovery.  Community Group     Participation Level:  {BHH PARTICIPATION LEVEL:22264}  Participation Quality:  {BHH PARTICIPATION QUALITY:22265}  Affect:  {BHH AFFECT:22266}  Cognitive:  {BHH COGNITIVE:22267}  Insight: {BHH Insight2:20797}  Engagement in Group:  {BHH ENGAGEMENT IN GROUP:22268}  Modes of Intervention:  {BHH MODES OF INTERVENTION:22269}  Additional Comments:  ***  Rai Sinagra A Christyne Mccain 11/18/2022, 6:22 PM  

## 2022-11-18 NOTE — BHH Suicide Risk Assessment (Signed)
[facility] Admission Suicide Risk Assessment   Nursing information obtained from:  Patient Demographic factors:  Male Current Mental Status:  NA Loss Factors:  Decrease in vocational status, Loss of significant relationship, Legal issues, Financial problems / change in socioeconomic status Historical Factors:  NA Risk Reduction Factors:  Positive coping skills or problem solving skills  Total Time spent with patient: 45 minutes Principal Problem: Major depressive disorder, recurrent severe without psychotic features (HCC) Diagnosis:  Principal Problem:   Major depressive disorder, recurrent severe without psychotic features (HCC) Active Problems:   Suicidal ideation  Subjective Data: Patient seen and chart reviewed.  Patient was cooperative and appropriate in the interview.  Endorses suicidal ideation without specific intent or plan.  Endorses depressed mood hopelessness fatigue and negative thinking about himself.  Multiple life stresses.  Patient is motivated to cooperate with treatment.  Continued Clinical Symptoms:    The "Alcohol Use Disorders Identification Test", Guidelines for Use in Primary Care, Second Edition.  World Science writer [facility]). Score between 0-7:  no or low risk or alcohol related problems. Score between 8-15:  moderate risk of alcohol related problems. Score between 16-19:  high risk of alcohol related problems. Score 20 or above:  warrants further diagnostic evaluation for alcohol dependence and treatment.   CLINICAL FACTORS:   Depression:   Anhedonia   Musculoskeletal: Strength & Muscle Tone: within normal limits Gait & Station: normal Patient leans: N/A  Psychiatric Specialty Exam:  Presentation  General Appearance: No data recorded Eye Contact:No data recorded Speech:No data recorded Speech Volume:No data recorded Handedness:No data recorded  Mood and Affect  Mood:No data recorded Affect:No data recorded  Thought Process  Thought  Processes:No data recorded Descriptions of Associations:No data recorded Orientation:No data recorded Thought Content:No data recorded History of Schizophrenia/Schizoaffective disorder:No  Duration of Psychotic Symptoms:No data recorded Hallucinations:No data recorded Ideas of Reference:No data recorded Suicidal Thoughts:No data recorded Homicidal Thoughts:No data recorded  Sensorium  Memory:No data recorded Judgment:No data recorded Insight:No data recorded  Executive Functions  Concentration:No data recorded Attention Span:No data recorded Recall:No data recorded Fund of Knowledge:No data recorded Language:No data recorded  Psychomotor Activity  Psychomotor Activity:No data recorded  Assets  Assets:No data recorded  Sleep  Sleep:No data recorded   Physical Exam: Physical Exam Vitals and nursing note reviewed.  Constitutional:      Appearance: Normal appearance.  HENT:     Head: Normocephalic and atraumatic.     Mouth/Throat:     Pharynx: Oropharynx is clear.  Eyes:     Pupils: Pupils are equal, round, and reactive to light.  Cardiovascular:     Rate and Rhythm: Normal rate and regular rhythm.  Pulmonary:     Effort: Pulmonary effort is normal.     Breath sounds: Normal breath sounds.  Abdominal:     General: Abdomen is flat.     Palpations: Abdomen is soft.  Musculoskeletal:        General: Normal range of motion.  Skin:    General: Skin is warm and dry.  Neurological:     General: No focal deficit present.     Mental Status: He is alert. Mental status is at baseline.  Psychiatric:        Attention and Perception: Attention normal.        Mood and Affect: Mood is depressed.        Speech: Speech normal.        Behavior: Behavior normal.        Thought  Content: Thought content includes suicidal ideation. Thought content does not include suicidal plan.        Cognition and Memory: Cognition normal.        Judgment: Judgment normal.    Review of  Systems  Constitutional: Negative.   HENT: Negative.    Eyes: Negative.   Respiratory: Negative.    Cardiovascular: Negative.   Gastrointestinal: Negative.   Musculoskeletal: Negative.   Skin: Negative.   Neurological: Negative.   Psychiatric/Behavioral:  Positive for depression and suicidal ideas. Negative for hallucinations, memory loss and substance abuse. The patient is not nervous/anxious and does not have insomnia.    Blood pressure 131/86, pulse 90, temperature 98.7 F (37.1 C), temperature source Oral, resp. rate (!) 21, height 5\' 10"  (1.778 m), weight 71.7 kg, SpO2 96 %. Body mass index is 22.67 kg/m.   COGNITIVE FEATURES THAT CONTRIBUTE TO RISK:  None    SUICIDE RISK:   Mild:  Suicidal ideation of limited frequency, intensity, duration, and specificity.  There are no identifiable plans, no associated intent, mild dysphoria and related symptoms, good self-control (both objective and subjective assessment), few other risk factors, and identifiable protective factors, including available and accessible social support.  PLAN OF CARE: Continue 15-minute checks.  Patient is agreeable to starting medication.  Engage in individual and group therapy.  Ongoing assessment of dangerousness prior to discharge  I certify that inpatient services furnished can reasonably be expected to improve the patient's condition.   Mordecai Rasmussen, MD 11/18/2022, 2:30 PM

## 2022-11-18 NOTE — BH IP Treatment Plan (Signed)
Interdisciplinary Treatment and Diagnostic Plan Update  11/18/2022 Time of Session: 0830 Noah Avery MRN: [id number]  Principal Diagnosis: Suicidal ideation  Secondary Diagnoses: Principal Problem:   Suicidal ideation   Current Medications:  Current Facility-Administered Medications  Medication Dose Route Frequency Provider Last Rate Last Admin   acetaminophen (TYLENOL) tablet 650 mg  650 mg Oral Q6H PRN Mcneil Sober, NP   650 mg at 11/18/22 4098   alum & mag hydroxide-simeth (MAALOX/MYLANTA) 200-200-20 MG/5ML suspension 30 mL  30 mL Oral Q4H PRN Penn, Cranston Neighbor, NP       diphenhydrAMINE (BENADRYL) capsule 50 mg  50 mg Oral TID PRN Mcneil Sober, NP       Or   diphenhydrAMINE (BENADRYL) injection 50 mg  50 mg Intramuscular TID PRN Penn, Cranston Neighbor, NP       haloperidol (HALDOL) tablet 5 mg  5 mg Oral TID PRN Mcneil Sober, NP       Or   haloperidol lactate (HALDOL) injection 5 mg  5 mg Intramuscular TID PRN Mcneil Sober, NP       hydrOXYzine (ATARAX) tablet 25 mg  25 mg Oral TID PRN Mcneil Sober, NP   25 mg at 11/18/22 1191   LORazepam (ATIVAN) tablet 2 mg  2 mg Oral TID PRN Mcneil Sober, NP       Or   LORazepam (ATIVAN) injection 2 mg  2 mg Intramuscular TID PRN Penn, Cranston Neighbor, NP       magnesium hydroxide (MILK OF MAGNESIA) suspension 30 mL  30 mL Oral Daily PRN Penn, Cicely, NP       traZODone (DESYREL) tablet 50 mg  50 mg Oral QHS PRN Penn, Cicely, NP       PTA Medications: No medications prior to admission.    Patient Stressors: Financial difficulties   Loss of relationships and job   Marital or family conflict   Occupational concerns   Traumatic event    Patient Strengths: Ability for insight  Average or above average intelligence  Capable of independent living  Forensic psychologist fund of knowledge  Motivation for treatment/growth  Religious Affiliation  Work skills   Treatment Modalities: Medication Management, Group therapy, Case management,  1 to 1 session  with clinician, Psychoeducation, Recreational therapy.   Physician Treatment Plan for Primary Diagnosis: Suicidal ideation Long Term Goal(s):     Short Term Goals:    Medication Management: Evaluate patient's response, side effects, and tolerance of medication regimen.  Therapeutic Interventions: 1 to 1 sessions, Unit Group sessions and Medication administration.  Evaluation of Outcomes: Progressing  Physician Treatment Plan for Secondary Diagnosis: Principal Problem:   Suicidal ideation  Long Term Goal(s):     Short Term Goals:       Medication Management: Evaluate patient's response, side effects, and tolerance of medication regimen.  Therapeutic Interventions: 1 to 1 sessions, Unit Group sessions and Medication administration.  Evaluation of Outcomes: Progressing   RN Treatment Plan for Primary Diagnosis: Suicidal ideation Long Term Goal(s): Knowledge of disease and therapeutic regimen to maintain health will improve  Short Term Goals: Ability to remain free from injury will improve, Ability to verbalize frustration and anger appropriately will improve, Ability to demonstrate self-control, Ability to participate in decision making will improve, Ability to verbalize feelings will improve, Ability to disclose and discuss suicidal ideas, Ability to identify and develop effective coping behaviors will improve, and Compliance with prescribed medications will improve  Medication Management: RN will administer medications as ordered by provider, will  assess and evaluate patient's response and provide education to patient for prescribed medication. RN will report any adverse and/or side effects to prescribing provider.  Therapeutic Interventions: 1 on 1 counseling sessions, Psychoeducation, Medication administration, Evaluate responses to treatment, Monitor vital signs and CBGs as ordered, Perform/monitor CIWA, COWS, AIMS and Fall Risk screenings as ordered, Perform wound care  treatments as ordered.  Evaluation of Outcomes: Progressing   LCSW Treatment Plan for Primary Diagnosis: Suicidal ideation Long Term Goal(s): Safe transition to appropriate next level of care at discharge, Engage patient in therapeutic group addressing interpersonal concerns.  Short Term Goals: Engage patient in aftercare planning with referrals and resources, Increase social support, Increase ability to appropriately verbalize feelings, Increase emotional regulation, Facilitate acceptance of mental health diagnosis and concerns, Facilitate patient progression through stages of change regarding substance use diagnoses and concerns, Identify triggers associated with mental health/substance abuse issues, and Increase skills for wellness and recovery  Therapeutic Interventions: Assess for all discharge needs, 1 to 1 time with Social worker, Explore available resources and support systems, Assess for adequacy in community support network, Educate family and significant other(s) on suicide prevention, Complete Psychosocial Assessment, Interpersonal group therapy.  Evaluation of Outcomes: Progressing   Progress in Treatment: Attending groups: No. Participating in groups: No. Taking medication as prescribed: Yes. Toleration medication: Yes. Family/Significant other contact made: No, will contact:  CSW to obtain consent to reach family/friend.  Patient understands diagnosis: Yes. Discussing patient identified problems/goals with staff: Yes. Medical problems stabilized or resolved: Yes. Denies suicidal/homicidal ideation: No. Issues/concerns per patient self-inventory: Yes. Other: none  New problem(s) identified: No, Describe:  none  New Short Term/Long Term Goal(s): Patient to work towards medication management for mood stabilization; elimination of SI thoughts; development of comprehensive mental wellness plan  Patient Goals:  Patient states their goal for treatment is to "I guess  nothing."  Discharge Plan or Barriers: Patient is homeless, CSW to discuss available housing resources prior to discharge.   Reason for Continuation of Hospitalization: Depression Medication stabilization Suicidal ideation  Estimated Length of Stay: 1-7 days   Scribe for Treatment Team: Almedia Balls 11/18/2022 9:39 AM

## 2022-11-18 NOTE — Progress Notes (Signed)
Pt denies HI/AVH but endorses SI with no plan and verbally agrees to approach staff if these become apparent or before harming themselves/others. Rates depression 5/10. Rates anxiety 5/10. Rates pain 6/10.  Pt went to one group. Pt was in and out of his room but has been hanging out in the dayroom this evening talking to other pts. Scheduled medications administered to pt, per MD orders. RN provided support and encouragement to pt. Q15 min safety checks implemented and continued. Pt is safe on the unit. Plan of care on going and no other concerns expressed at this time.  11/18/22 0981  Psych Admission Type (Psych Patients Only)  Admission Status Voluntary  Psychosocial Assessment  Patient Complaints Anxiety;Depression  Eye Contact Fair  Facial Expression Animated;Anxious  Affect Anxious;Depressed  Speech Logical/coherent  Interaction Assertive  Motor Activity Slow  Appearance/Hygiene Unremarkable  Behavior Characteristics Cooperative;Appropriate to situation;Anxious  Mood Depressed;Anxious;Pleasant  Aggressive Behavior  Effect No apparent injury  Thought Process  Coherency WDL  Content WDL  Delusions None reported or observed  Perception WDL  Hallucination None reported or observed  Judgment Impaired  Confusion None  Danger to Self  Current suicidal ideation? Passive  Self-Injurious Behavior No self-injurious ideation or behavior indicators observed or expressed   Agreement Not to Harm Self Yes  Description of Agreement verbal  Danger to Others  Danger to Others None reported or observed

## 2022-11-18 NOTE — Group Note (Signed)
[facility] LCSW Group Therapy Note   Group Date: 11/18/2022 Start Time: 1300 End Time: 1400   Type of Therapy/Topic:  Group Therapy:  Emotion Regulation  Participation Level:  Did Not Attend   Mood:  Description of Group:    The purpose of this group is to assist patients in learning to regulate negative emotions and experience positive emotions. Patients will be guided to discuss ways in which they have been vulnerable to their negative emotions. These vulnerabilities will be juxtaposed with experiences of positive emotions or situations, and patients challenged to use positive emotions to combat negative ones. Special emphasis will be placed on coping with negative emotions in conflict situations, and patients will process healthy conflict resolution skills.  Therapeutic Goals: Patient will identify two positive emotions or experiences to reflect on in order to balance out negative emotions:  Patient will label two or more emotions that they find the most difficult to experience:  Patient will be able to demonstrate positive conflict resolution skills through discussion or role plays:   Summary of Patient Progress:  Patient did not attend group despite encouraged participation.    Therapeutic Modalities:   Cognitive Behavioral Therapy Feelings Identification Dialectical Behavioral Therapy   Erabella Kuipers W Alka Falwell, LCSWA 

## 2022-11-19 ENCOUNTER — Encounter: Payer: Self-pay | Admitting: Psychiatry

## 2022-11-19 DIAGNOSIS — F332 Major depressive disorder, recurrent severe without psychotic features: Secondary | ICD-10-CM | POA: Diagnosis not present

## 2022-11-19 MED ORDER — MIRTAZAPINE 15 MG PO TABS
15.0000 mg | ORAL_TABLET | Freq: Every day | ORAL | Status: DC
  Administered 2022-11-19 – 2022-11-30 (×11): 15 mg via ORAL
  Filled 2022-11-19 (×12): qty 1

## 2022-11-19 NOTE — Progress Notes (Signed)
[facility] MD Progress Note  11/19/2022 12:40 PM Noah Avery  MRN:  [id number] Subjective: Follow-up for this 52 year old man with depression and homelessness.  Patient says he still did not sleep well last night with the trazodone.  Feeling a little groggy today.  Somewhat nervous.  Mood slightly better.  No active suicidal thought no physical complaints Principal Problem: Major depressive disorder, recurrent severe without psychotic features (HCC) Diagnosis: Principal Problem:   Major depressive disorder, recurrent severe without psychotic features (HCC) Active Problems:   Suicidal ideation  Total Time spent with patient: 30 minutes  Past Psychiatric History: Past history of depression  Past Medical History:  Past Medical History:  Diagnosis Date   Depression    H/O degenerative disc disease     Past Surgical History:  Procedure Laterality Date   BACK SURGERY     Family History: History reviewed. No pertinent family history. Family Psychiatric  History: See previous Social History:  Social History   Substance and Sexual Activity  Alcohol Use Yes   Comment: occasional     Social History   Substance and Sexual Activity  Drug Use No    Social History   Socioeconomic History   Marital status: Divorced    Spouse name: Not on file   Number of children: Not on file   Years of education: Not on file   Highest education level: Not on file  Occupational History   Not on file  Tobacco Use   Smoking status: Every Day    Packs/day: 1.00    Years: 15.00    Additional pack years: 0.00    Total pack years: 15.00    Types: Cigarettes    Passive exposure: Current   Smokeless tobacco: Never  Vaping Use   Vaping Use: Never used  Substance and Sexual Activity   Alcohol use: Yes    Comment: occasional   Drug use: No   Sexual activity: Not on file  Other Topics Concern   Not on file  Social History Narrative   Not on file   Social Determinants of Health   Financial  Resource Strain: Not on file  Food Insecurity: Food Insecurity Present (11/17/2022)   Hunger Vital Sign    Worried About Running Out of Food in the Last Year: Often true    Ran Out of Food in the Last Year: Often true  Transportation Needs: Unmet Transportation Needs (11/17/2022)   PRAPARE - Administrator, Civil Service (Medical): Yes    Lack of Transportation (Non-Medical): Yes  Physical Activity: Not on file  Stress: Not on file  Social Connections: Not on file   Additional Social History:                         Sleep: Fair  Appetite:  Fair  Current Medications: Current Facility-Administered Medications  Medication Dose Route Frequency Provider Last Rate Last Admin   acetaminophen (TYLENOL) tablet 650 mg  650 mg Oral Q6H PRN Penn, Cranston Neighbor, NP   650 mg at 11/18/22 1715   alum & mag hydroxide-simeth (MAALOX/MYLANTA) 200-200-20 MG/5ML suspension 30 mL  30 mL Oral Q4H PRN Penn, Cicely, NP       FLUoxetine (PROZAC) capsule 20 mg  20 mg Oral Daily Jader Desai T, MD   20 mg at 11/19/22 0905   hydrOXYzine (ATARAX) tablet 25 mg  25 mg Oral TID PRN Mcneil Sober, NP   25 mg at 11/18/22 1714  magnesium hydroxide (MILK OF MAGNESIA) suspension 30 mL  30 mL Oral Daily PRN Penn, Cicely, NP       mirtazapine (REMERON) tablet 15 mg  15 mg Oral QHS Floyed Masoud T, MD       nicotine (NICODERM CQ - dosed in mg/24 hours) patch 21 mg  21 mg Transdermal Daily Cortney Beissel T, MD   21 mg at 11/19/22 1610   nicotine polacrilex (NICORETTE) gum 2 mg  2 mg Oral PRN Antavion Bartoszek, Jackquline Denmark, MD        Lab Results: No results found for this or any previous visit (from the past 48 hour(s)).  Blood Alcohol level:  Lab Results  Component Value Date   ETH <10 11/16/2022   ETH <5 01/04/2016    Metabolic Disorder Labs: Lab Results  Component Value Date   HGBA1C 5.3 02/11/2022   MPG 105.41 02/11/2022   No results found for: "PROLACTIN" Lab Results  Component Value Date   CHOL 155  11/15/2008   TRIG 155.0 (H) 11/15/2008   HDL 31.00 (L) 11/15/2008   CHOLHDL 5 11/15/2008   VLDL 31.0 11/15/2008   LDLCALC 93 11/15/2008    Physical Findings: AIMS: Facial and Oral Movements Muscles of Facial Expression: None, normal Lips and Perioral Area: None, normal Jaw: None, normal Tongue: None, normal,Extremity Movements Upper (arms, wrists, hands, fingers): None, normal Lower (legs, knees, ankles, toes): None, normal, Trunk Movements Neck, shoulders, hips: None, normal, Overall Severity Severity of abnormal movements (highest score from questions above): None, normal Incapacitation due to abnormal movements: None, normal Patient's awareness of abnormal movements (rate only patient's report): No Awareness, Dental Status Current problems with teeth and/or dentures?: No Does patient usually wear dentures?: No  CIWA:    COWS:     Musculoskeletal: Strength & Muscle Tone: within normal limits Gait & Station: normal Patient leans: N/A  Psychiatric Specialty Exam:  Presentation  General Appearance: No data recorded Eye Contact:No data recorded Speech:No data recorded Speech Volume:No data recorded Handedness:No data recorded  Mood and Affect  Mood:No data recorded Affect:No data recorded  Thought Process  Thought Processes:No data recorded Descriptions of Associations:No data recorded Orientation:No data recorded Thought Content:No data recorded History of Schizophrenia/Schizoaffective disorder:No  Duration of Psychotic Symptoms:No data recorded Hallucinations:No data recorded Ideas of Reference:No data recorded Suicidal Thoughts:No data recorded Homicidal Thoughts:No data recorded  Sensorium  Memory:No data recorded Judgment:No data recorded Insight:No data recorded  Executive Functions  Concentration:No data recorded Attention Span:No data recorded Recall:No data recorded Fund of Knowledge:No data recorded Language:No data recorded  Psychomotor  Activity  Psychomotor Activity:No data recorded  Assets  Assets:No data recorded  Sleep  Sleep:No data recorded   Physical Exam: Physical Exam Vitals reviewed.  Constitutional:      Appearance: Normal appearance.  HENT:     Head: Normocephalic and atraumatic.     Mouth/Throat:     Pharynx: Oropharynx is clear.  Eyes:     Pupils: Pupils are equal, round, and reactive to light.  Cardiovascular:     Rate and Rhythm: Normal rate and regular rhythm.  Pulmonary:     Effort: Pulmonary effort is normal.     Breath sounds: Normal breath sounds.  Abdominal:     General: Abdomen is flat.     Palpations: Abdomen is soft.  Musculoskeletal:        General: Normal range of motion.  Skin:    General: Skin is warm and dry.  Neurological:     General: No  focal deficit present.     Mental Status: He is alert. Mental status is at baseline.  Psychiatric:        Attention and Perception: Attention normal.        Mood and Affect: Mood normal.        Speech: Speech normal.        Behavior: Behavior is cooperative.        Thought Content: Thought content normal.        Cognition and Memory: Cognition normal.    Review of Systems  Constitutional: Negative.   HENT: Negative.    Eyes: Negative.   Respiratory: Negative.    Cardiovascular: Negative.   Gastrointestinal: Negative.   Musculoskeletal: Negative.   Skin: Negative.   Neurological: Negative.   Psychiatric/Behavioral:  Negative for depression. The patient has insomnia.    Blood pressure 116/88, pulse (!) 103, temperature 98 F (36.7 C), temperature source Oral, resp. rate 19, height 5\' 10"  (1.778 m), weight 71.7 kg, SpO2 95 %. Body mass index is 22.67 kg/m.   Treatment Plan Summary: Medication management and Plan no change to Prozac.  Discontinue trazodone and try Remeron for sleep instead.  Reminded patient he has access to the hydroxyzine.  Mordecai Rasmussen, MD 11/19/2022, 12:40 PM

## 2022-11-19 NOTE — Plan of Care (Signed)
D- Patient alert and oriented. Patient presented in a pleasant mood on assessment reporting that he slept "fair" last night and had complaints of back and arm pain. Patient rated his pain a "5/10", but did not request any PRN medication to help with relief. Patient endorsed hopelessness, depression, and anxiety on his self-inventory, stating that "just my current life", is why he's feeling this way. Although patient endorsed passive SI on his self-inventory, he denied SI, HI, AVH at this time. Patient's stated goal for today is to "talk to everyone. Get a plan going", in which he will "talk to people", in order to achieve his goal.  A- Scheduled medications administered to patient, per MD orders. Support and encouragement provided.  Routine safety checks conducted every 15 minutes.  Patient informed to notify staff with problems or concerns.  R- No adverse drug reactions noted. Patient contracts for safety at this time. Patient compliant with medications and treatment plan. Patient receptive, calm, and cooperative. Patient interacts well with others on the unit. Patient remains safe at this time.  Problem: Education: Goal: Knowledge of General Education information will improve Description: Including pain rating scale, medication(s)/side effects and non-pharmacologic comfort measures Outcome: Progressing   Problem: Health Behavior/Discharge Planning: Goal: Ability to manage health-related needs will improve Outcome: Progressing   Problem: Clinical Measurements: Goal: Ability to maintain clinical measurements within normal limits will improve Outcome: Progressing Goal: Will remain free from infection Outcome: Progressing Goal: Diagnostic test results will improve Outcome: Progressing Goal: Respiratory complications will improve Outcome: Progressing Goal: Cardiovascular complication will be avoided Outcome: Progressing   Problem: Activity: Goal: Risk for activity intolerance will  decrease Outcome: Progressing   Problem: Nutrition: Goal: Adequate nutrition will be maintained Outcome: Progressing   Problem: Coping: Goal: Level of anxiety will decrease Outcome: Progressing   Problem: Elimination: Goal: Will not experience complications related to bowel motility Outcome: Progressing Goal: Will not experience complications related to urinary retention Outcome: Progressing   Problem: Pain Managment: Goal: General experience of comfort will improve Outcome: Progressing   Problem: Safety: Goal: Ability to remain free from injury will improve Outcome: Progressing   Problem: Skin Integrity: Goal: Risk for impaired skin integrity will decrease Outcome: Progressing   Problem: Education: Goal: Utilization of techniques to improve thought processes will improve Outcome: Progressing Goal: Knowledge of the prescribed therapeutic regimen will improve Outcome: Progressing   Problem: Activity: Goal: Interest or engagement in leisure activities will improve Outcome: Progressing Goal: Imbalance in normal sleep/wake cycle will improve Outcome: Progressing   Problem: Coping: Goal: Coping ability will improve Outcome: Progressing Goal: Will verbalize feelings Outcome: Progressing   Problem: Health Behavior/Discharge Planning: Goal: Ability to make decisions will improve Outcome: Progressing Goal: Compliance with therapeutic regimen will improve Outcome: Progressing   Problem: Role Relationship: Goal: Will demonstrate positive changes in social behaviors and relationships Outcome: Progressing   Problem: Safety: Goal: Ability to disclose and discuss suicidal ideas will improve Outcome: Progressing Goal: Ability to identify and utilize support systems that promote safety will improve Outcome: Progressing   Problem: Self-Concept: Goal: Will verbalize positive feelings about self Outcome: Progressing Goal: Level of anxiety will decrease Outcome:  Progressing   Problem: Education: Goal: Ability to state activities that reduce stress will improve Outcome: Progressing   Problem: Coping: Goal: Ability to identify and develop effective coping behavior will improve Outcome: Progressing   Problem: Self-Concept: Goal: Ability to identify factors that promote anxiety will improve Outcome: Progressing Goal: Level of anxiety will decrease Outcome: Progressing Goal: Ability  to modify response to factors that promote anxiety will improve Outcome: Progressing

## 2022-11-19 NOTE — [facility] Counselor (Signed)
Adult Comprehensive Assessment  Patient ID: Noah Avery, male   DOB: 07-Oct-1970, 52 y.o.   MRN: [id number]  Information Source: Information source: Patient  Current Stressors:  Patient states their primary concerns and needs for treatment are:: "Seriously debating hurting myself." Patient states their goals for this hospitilization and ongoing recovery are:: "Try medication again." Educational / Learning stressors: None reported Employment / Job issues: Pt is currently applying for jobs but his phone is broken so they cannot contact him and he cannot contact them. Family Relationships: None reported Financial / Lack of resources (include bankruptcy): He has no income. Housing / Lack of housing: Pt is homeless. Physical health (include injuries & life threatening diseases): Degenerative disc disorder Social relationships: None reported Substance abuse: Pt endorses some use of alcohol Bereavement / Loss: None reported  Living/Environment/Situation:  Living Arrangements: Other (Comment) (Pt is homeless) Living conditions (as described by patient or guardian): Homeless, staying at a hotel Who else lives in the home?: By himself How long has patient lived in current situation?: "A little over two weeks." What is atmosphere in current home: Chaotic, Temporary  Family History:  Marital status: Divorced Divorced, when?: Unable to assess What types of issues is patient dealing with in the relationship?: Unfaithfulness Are you sexually active?: No What is your sexual orientation?: "Straight" Has your sexual activity been affected by drugs, alcohol, medication, or emotional stress?: N/A Does patient have children?: Yes How many children?: 5 (Three adult children and two minor children.) How is patient's relationship with their children?: "Pretty good, could be better." He shares that the oldest three don't have room for him. The middle child is about to graduate from high school and the  youngest has about two years left in high school.  Childhood History:  By whom was/is the patient raised?: Mother, Grandparents Additional childhood history information: Pt was raised by his mother who was a single mother. "Loving but not correct." He shares that his father left (divorced) the same month the pt was born. He endorses not meeting his father until was was 93-36 years of age. Pt states that his maternal grandmother also helped raise him. Description of patient's relationship with caregiver when they were a child: "Good, real good." Patient's description of current relationship with people who raised him/her: Pt's mother and maternal grandmother are both deceased. How were you disciplined when you got in trouble as a child/adolescent?: "They didn't beat me. Spankings, hickory switch, but usually I was a good child." Does patient have siblings?: Yes Number of Siblings: 1 (One sister who is 15 years younger than pt) Description of patient's current relationship with siblings: "At first it was great then it took a turn, then took an even worse turn. Past year has been better." Did patient suffer any verbal/emotional/physical/sexual abuse as a child?: No Did patient suffer from severe childhood neglect?: No Has patient ever been sexually abused/assaulted/raped as an adolescent or adult?: No Was the patient ever a victim of a crime or a disaster?: No Witnessed domestic violence?: No Has patient been affected by domestic violence as an adult?: No  Education:  Highest grade of school patient has completed: High school graduate, pt does share that he dropped out junior year but returned after a semester and finished his education. Currently a student?: No Learning disability?: No  Employment/Work Situation:   Employment Situation: Unemployed Patient's Job has Been Impacted by Current Illness: No What is the Longest Time Patient has Held a Job?: "The 13 years  on and off; however, as far  as job jobs, about 10-12 years" Where was the Patient Employed at that Time?: [employer]; [employer] Has Patient ever Been in [employer]?: Yes (Describe in comment) (Pt shared that he was part of the [employer] starting around 1991 and there for 13 years on and off.) Did You Receive Any Psychiatric Treatment/Services While in the [employer]?: No  Financial Resources:   Financial resources: Medicaid Does patient have a Lawyer or guardian?: No  Alcohol/Substance Abuse:   What has been your use of drugs/alcohol within the last 12 months?: Pt shares that his alcohol use has not been horrible. He states that he may drink a 12 pack a month or 1-3 beers a day. If attempted suicide, did drugs/alcohol play a role in this?:  (N/A) Alcohol/Substance Abuse Treatment Hx: Denies past history If yes, describe treatment: N/A Has alcohol/substance abuse ever caused legal problems?: No  Social Support System:   Forensic psychologist System: Poor Describe Community Support System: "If I don't have my phone or computer there is no support system." Type of faith/religion: "Maybe" How does patient's faith help to cope with current illness?: He shares that he is "warming back up" to it.  Leisure/Recreation:   Do You Have Hobbies?: Yes Leisure and Hobbies: "Video games, used to collect comic books."  Strengths/Needs:   What is the patient's perception of their strengths?: "Honestly about anything I put my mind to do." Patient states they can use these personal strengths during their treatment to contribute to their recovery: N/A Patient states these barriers may affect/interfere with their treatment: Pt shares that transportation and housing are potential barriers to continued treatment. Patient states these barriers may affect their return to the community: He denies having any housing.  Discharge Plan:   Currently receiving community mental health services: No Patient states  concerns and preferences for aftercare planning are: He is interested in getting an appointment with [facility]. Patient states they will know when they are safe and ready for discharge when: "To be honest with you, I don't know." Does patient have access to transportation?: No Does patient have financial barriers related to discharge medications?: Yes Patient description of barriers related to discharge medications: Pt shares that he does not know what kind of Medicaid that he has and what it covers exactly. Plan for no access to transportation at discharge: CSW will assist pt with transportation arrangements upon discharge. Plan for living situation after discharge: Pt is looking to find somewhere to live post discharge. Will patient be returning to same living situation after discharge?: No  Summary/Recommendations:   Summary and Recommendations (to be completed by the evaluator): Pt is a 52 year old, twice divorced, father of five (three adult children and two minors who are with their mother) from [place], [facility] [place]). He shared that he came to the hospital because he was "seriously debating hurting" himself. Pt expressed desire to try getting back on medication again. He shared that he has been homeless but being provided some housing assistance through [employer]. Pt stated that he had been staying at a hotel for a little over two weeks prior to admission but will not be able to do that after discharge. Lack of transportation, being unemployed, homelessness, broken phone, and degenerative disc disease were identified as stressors for the pt. He denied any history of trauma or abuse. Pt endorsed some alcohol use, stating that he drinks a 12 pack a month or one to three beers  daily. He denied any involvement with substance use services or legal issues due to his use. Pt has a primary diagnosis of Major depressive disorder, recurrent severe without psychotic features. Upon discharge, pt would  like to have an appointment scheduled with [facility] for continued outpatient treatment. Pt and CSW discussed housing options and pt was agreeable to boarding house. Recommendations include: crisis stabilization, therapeutic milieu, encourage group attendance and participation, medication management for mood stabilization and development of a comprehensive mental wellness plan.  Glenis Smoker. 11/19/2022

## 2022-11-19 NOTE — Group Note (Signed)
Recreation Therapy Group Note   Group Topic:Self-Esteem  Group Date: 11/19/2022 Start Time: 1000 End Time: 1100 Facilitators: Rosina Lowenstein, LRT, CTRS Location:  Craft Room  Group Description: Patients and LRT discussed the importance of self-love and self-esteem. Pt completed a worksheet that helps them identify 24 different strengths and qualities about themselves. Pt encouraged to read aloud at least 3 off their sheet to the group. LRT and pts discussed how this can be applied to daily life post-discharge.  Pt's then played "Positive Affirmation Bingo" afterwards, with journals, activity books or stress balls as bingo prizes.  Goal Area(s) Addressed: Patient will identify positive qualities about themselves. Patient will learn new positive affirmations.  Patient will recite positive qualities and affirmations aloud to the group.  Patient will increase communication.  Affect/Mood: Appropriate   Participation Level: Active and Engaged   Participation Quality: Independent   Behavior: Appropriate, Calm, and Cooperative   Speech/Thought Process: Coherent   Insight: Good   Judgement: Good   Modes of Intervention: Activity and Worksheet   Patient Response to Interventions:  Attentive, Engaged, Interested , and Receptive   Education Outcome:  Acknowledges education   Clinical Observations/Individualized Feedback: Noah Avery was active in their participation of session activities and group discussion. Pt identified "I like my facial hair and my eye color. I found this exercise to be difficult because in the current state I am in, its hard to think positively about myself. It made me think." Pt played multiple rounds of bingo and chose a cross word book as his prize. Pt spontaneously contributed to group discussion on more than one occasion. Pt interacted well with LRT and peers duration of session.    Plan: Continue to engage patient in RT group sessions 2-3x/week.   Rosina Lowenstein, LRT, CTRS 11/19/2022 11:53 AM

## 2022-11-19 NOTE — Group Note (Signed)
[facility] LCSW Group Therapy Note   Group Date: 11/19/2022 Start Time: 1300 End Time: 1350   Type of Therapy/Topic:  Group Therapy:  Balance in Life  Participation Level:  Did Not Attend   Description of Group:    This group will address the concept of balance and how it feels and looks when one is unbalanced. Patients will be encouraged to process areas in their lives that are out of balance, and identify reasons for remaining unbalanced. Facilitators will guide patients utilizing problem- solving interventions to address and correct the stressor making their life unbalanced. Understanding and applying boundaries will be explored and addressed for obtaining  and maintaining a balanced life. Patients will be encouraged to explore ways to assertively make their unbalanced needs known to significant others in their lives, using other group members and facilitator for support and feedback.  Therapeutic Goals: Patient will identify two or more emotions or situations they have that consume much of in their lives. Patient will identify signs/triggers that life has become out of balance:  Patient will identify two ways to set boundaries in order to achieve balance in their lives:  Patient will demonstrate ability to communicate their needs through discussion and/or role plays  Summary of Patient Progress: X   Therapeutic Modalities:   Cognitive Behavioral Therapy Solution-Focused Therapy Assertiveness Training   Annabell Oconnor R Kevork Joyce, LCSW 

## 2022-11-19 NOTE — [facility] Suicide Risk Assessment (Signed)
[facility] INPATIENT:  Family/Significant Other Suicide Prevention Education  Suicide Prevention Education:  Contact Attempts: Rocky Link Smith/pastor [phone]), has been identified by the patient as the family member/significant other with whom the patient will be residing, and identified as the person(s) who will aid the patient in the event of a mental health crisis.  With written consent from the patient, two attempts were made to provide suicide prevention education, prior to and/or following the patient's discharge.  We were unsuccessful in providing suicide prevention education.  A suicide education pamphlet was given to the patient to share with family/significant other.  Date and time of first attempt: 11/19/22 at 09:43 Date and time of second attempt: Second attempt needed.  CSW spoke with representative from [facility] who was working remotely. She took down information to have Nicanor Bake give CSW a call back. No concerns expressed. Contact ended without incident.   Glenis Smoker 11/19/2022, 10:41 AM

## 2022-11-19 NOTE — Group Note (Signed)
Date:  11/19/2022 Time:  11:09 PM  Group Topic/Focus:  Wrap-Up Group:   The focus of this group is to help patients review their daily goal of treatment and discuss progress on daily workbooks.    Participation Level:  Active  Participation Quality:  Appropriate and Attentive  Affect:  Appropriate and Excited  Cognitive:  Alert and Appropriate  Insight: Good and Improving  Engagement in Group:  Engaged and Improving  Modes of Intervention:  Education and Support  Additional Comments:     Noah Avery 11/19/2022, 11:09 PM

## 2022-11-19 NOTE — Progress Notes (Signed)
Patient calm and pleasant during assessment denying SI/HI/AVH. Pt endorses anxiety and depression. Pt observed interacting appropriately with staff and peers on the unit. Pt requested PRN sleep medication, check MAR. Pt given education, support, and encouragement to be active in his treatment plan. Pt being monitored Q 15 minutes for safety pre unit protocol, remains safe on the unit

## 2022-11-19 NOTE — Group Note (Signed)
Date:  11/19/2022 Time:  9:56 AM  Group Topic/Focus:  Community Meeting    Participation Level:  Active  Participation Quality:  Appropriate  Affect:  Appropriate  Cognitive:  Appropriate  Insight: Appropriate  Engagement in Group:  Engaged  Modes of Intervention:  Discussion, Education, and Support  Additional Comments:    [name] 11/19/2022, 9:56 AM

## 2022-11-20 DIAGNOSIS — F332 Major depressive disorder, recurrent severe without psychotic features: Secondary | ICD-10-CM | POA: Diagnosis not present

## 2022-11-20 MED ORDER — TRAZODONE HCL 100 MG PO TABS
100.0000 mg | ORAL_TABLET | Freq: Every evening | ORAL | Status: DC | PRN
  Administered 2022-11-20 – 2022-11-29 (×4): 100 mg via ORAL
  Filled 2022-11-20 (×5): qty 1

## 2022-11-20 NOTE — Group Note (Signed)
Recreation Therapy Group Note   Group Topic:Leisure Education  Group Date: 11/20/2022 Start Time: 1000 End Time: 1100 Facilitators: Rosina Lowenstein, LRT, CTRS Location:  Craft Room  Group Description: Leisure. Patients were given the option to choose from singing karaoke, making origami, using oil pastels, coloring mandalas, or playing UNO. LRT and pts discussed the meaning of leisure, the importance of participating in leisure during their free time/when they're outside of the hospital, as well as how our leisure interests can also serve as coping skills. Pt identified two leisure interests and shared with the group.   Goal Area(s) Addressed:  Patient will identify a current leisure interest.  Patient will learn the definition of "leisure". Patient will practice making a positive decision. Patient will have the opportunity to try a new leisure activity. Patient will communicate with peers and LRT.   Affect/Mood: Appropriate   Participation Level: Active and Engaged   Participation Quality: Independent   Behavior: Calm and Cooperative   Speech/Thought Process: Coherent   Insight: Good   Judgement: Good   Modes of Intervention: Activity   Patient Response to Interventions:  Attentive, Engaged, Interested , and Receptive   Education Outcome:  Acknowledges education   Clinical Observations/Individualized Feedback: Noah Avery was active in their participation of session activities and group discussion. Pt identified "walk and play video games" as things he enjoys doing in his free time outside of the hospital. Pt chose to listen to music and make origami while in group. Pt interacted well with LRT and peers duration of session.   Plan: Continue to engage patient in RT group sessions 2-3x/week.   Rosina Lowenstein, LRT, CTRS 11/20/2022 11:33 AM

## 2022-11-20 NOTE — Group Note (Signed)
Date:  11/20/2022 Time:  9:55 AM  Group Topic/Focus:  Goals Group:   The focus of this group is to help patients establish daily goals to achieve during treatment and discuss how the patient can incorporate goal setting into their daily lives to aide in recovery.    Participation Level:  Active  Participation Quality:  Appropriate  Affect:  Appropriate  Cognitive:  Alert and Appropriate  Insight: Appropriate  Engagement in Group:  Engaged  Modes of Intervention:  Discussion, Education, and Support  Additional Comments:    [name] 11/20/2022, 9:55 AM

## 2022-11-20 NOTE — Progress Notes (Signed)
[facility] MD Progress Note  11/20/2022 3:23 PM Noah Avery  MRN:  [id number] Subjective: Follow-up patient with depression and homelessness.  No new complaints.  Still said he did not sleep well last night.  Mood stable affect stable no suicidal thoughts.  Participates well in activities on the unit. Principal Problem: Major depressive disorder, recurrent severe without psychotic features (HCC) Diagnosis: Principal Problem:   Major depressive disorder, recurrent severe without psychotic features (HCC) Active Problems:   Suicidal ideation  Total Time spent with patient: 30 minutes  Past Psychiatric History: Past history of depression   Past Medical History:  Past Medical History:  Diagnosis Date   Depression    H/O degenerative disc disease     Past Surgical History:  Procedure Laterality Date   BACK SURGERY     Family History: History reviewed. No pertinent family history. Family Psychiatric  History: See previous Social History:  Social History   Substance and Sexual Activity  Alcohol Use Yes   Comment: occasional     Social History   Substance and Sexual Activity  Drug Use No    Social History   Socioeconomic History   Marital status: Divorced    Spouse name: Not on file   Number of children: Not on file   Years of education: Not on file   Highest education level: Not on file  Occupational History   Not on file  Tobacco Use   Smoking status: Every Day    Packs/day: 1.00    Years: 15.00    Additional pack years: 0.00    Total pack years: 15.00    Types: Cigarettes    Passive exposure: Current   Smokeless tobacco: Never  Vaping Use   Vaping Use: Never used  Substance and Sexual Activity   Alcohol use: Yes    Comment: occasional   Drug use: No   Sexual activity: Not on file  Other Topics Concern   Not on file  Social History Narrative   Not on file   Social Determinants of Health   Financial Resource Strain: Not on file  Food Insecurity: Food  Insecurity Present (11/17/2022)   Hunger Vital Sign    Worried About Running Out of Food in the Last Year: Often true    Ran Out of Food in the Last Year: Often true  Transportation Needs: Unmet Transportation Needs (11/17/2022)   PRAPARE - Administrator, Civil Service (Medical): Yes    Lack of Transportation (Non-Medical): Yes  Physical Activity: Not on file  Stress: Not on file  Social Connections: Not on file   Additional Social History:                         Sleep: Fair  Appetite:  Fair  Current Medications: Current Facility-Administered Medications  Medication Dose Route Frequency Provider Last Rate Last Admin   acetaminophen (TYLENOL) tablet 650 mg  650 mg Oral Q6H PRN Penn, Cranston Neighbor, NP   650 mg at 11/20/22 1224   alum & mag hydroxide-simeth (MAALOX/MYLANTA) 200-200-20 MG/5ML suspension 30 mL  30 mL Oral Q4H PRN Penn, Cicely, NP       FLUoxetine (PROZAC) capsule 20 mg  20 mg Oral Daily Shun Pletz T, MD   20 mg at 11/20/22 3086   hydrOXYzine (ATARAX) tablet 25 mg  25 mg Oral TID PRN Mcneil Sober, NP   25 mg at 11/20/22 0832   magnesium hydroxide (MILK OF MAGNESIA) suspension 30  mL  30 mL Oral Daily PRN Penn, Cranston Neighbor, NP       mirtazapine (REMERON) tablet 15 mg  15 mg Oral QHS Erandi Lemma T, MD   15 mg at 11/19/22 2028   nicotine (NICODERM CQ - dosed in mg/24 hours) patch 21 mg  21 mg Transdermal Daily Heitor Steinhoff, Jackquline Denmark, MD   21 mg at 11/20/22 1610   nicotine polacrilex (NICORETTE) gum 2 mg  2 mg Oral PRN Kasin Tonkinson, Jackquline Denmark, MD        Lab Results: No results found for this or any previous visit (from the past 48 hour(s)).  Blood Alcohol level:  Lab Results  Component Value Date   ETH <10 11/16/2022   ETH <5 01/04/2016    Metabolic Disorder Labs: Lab Results  Component Value Date   HGBA1C 5.3 02/11/2022   MPG 105.41 02/11/2022   No results found for: "PROLACTIN" Lab Results  Component Value Date   CHOL 155 11/15/2008   TRIG 155.0 (H)  11/15/2008   HDL 31.00 (L) 11/15/2008   CHOLHDL 5 11/15/2008   VLDL 31.0 11/15/2008   LDLCALC 93 11/15/2008    Physical Findings: AIMS: Facial and Oral Movements Muscles of Facial Expression: None, normal Lips and Perioral Area: None, normal Jaw: None, normal Tongue: None, normal,Extremity Movements Upper (arms, wrists, hands, fingers): None, normal Lower (legs, knees, ankles, toes): None, normal, Trunk Movements Neck, shoulders, hips: None, normal, Overall Severity Severity of abnormal movements (highest score from questions above): None, normal Incapacitation due to abnormal movements: None, normal Patient's awareness of abnormal movements (rate only patient's report): No Awareness, Dental Status Current problems with teeth and/or dentures?: No Does patient usually wear dentures?: No  CIWA:    COWS:     Musculoskeletal: Strength & Muscle Tone: within normal limits Gait & Station: normal Patient leans: N/A  Psychiatric Specialty Exam:  Presentation  General Appearance: No data recorded Eye Contact:No data recorded Speech:No data recorded Speech Volume:No data recorded Handedness:No data recorded  Mood and Affect  Mood:No data recorded Affect:No data recorded  Thought Process  Thought Processes:No data recorded Descriptions of Associations:No data recorded Orientation:No data recorded Thought Content:No data recorded History of Schizophrenia/Schizoaffective disorder:No  Duration of Psychotic Symptoms:No data recorded Hallucinations:No data recorded Ideas of Reference:No data recorded Suicidal Thoughts:No data recorded Homicidal Thoughts:No data recorded  Sensorium  Memory:No data recorded Judgment:No data recorded Insight:No data recorded  Executive Functions  Concentration:No data recorded Attention Span:No data recorded Recall:No data recorded Fund of Knowledge:No data recorded Language:No data recorded  Psychomotor Activity  Psychomotor  Activity:No data recorded  Assets  Assets:No data recorded  Sleep  Sleep:No data recorded   Physical Exam: Physical Exam Vitals and nursing note reviewed.  Constitutional:      Appearance: Normal appearance.  HENT:     Head: Normocephalic and atraumatic.     Mouth/Throat:     Pharynx: Oropharynx is clear.  Eyes:     Pupils: Pupils are equal, round, and reactive to light.  Cardiovascular:     Rate and Rhythm: Normal rate and regular rhythm.  Pulmonary:     Effort: Pulmonary effort is normal.     Breath sounds: Normal breath sounds.  Abdominal:     General: Abdomen is flat.     Palpations: Abdomen is soft.  Musculoskeletal:        General: Normal range of motion.  Skin:    General: Skin is warm and dry.  Neurological:     General: No focal  deficit present.     Mental Status: He is alert. Mental status is at baseline.  Psychiatric:        Attention and Perception: Attention normal.        Mood and Affect: Mood normal.        Speech: Speech normal.        Behavior: Behavior normal.        Thought Content: Thought content normal.        Cognition and Memory: Cognition normal.    Review of Systems  Constitutional: Negative.   HENT: Negative.    Eyes: Negative.   Respiratory: Negative.    Cardiovascular: Negative.   Gastrointestinal: Negative.   Musculoskeletal: Negative.   Skin: Negative.   Neurological: Negative.   Psychiatric/Behavioral:  Negative for depression and suicidal ideas. The patient has insomnia.    Blood pressure (!) 131/98, pulse (!) 107, temperature 98.2 F (36.8 C), temperature source Oral, resp. rate 19, height 5\' 10"  (1.778 m), weight 71.7 kg, SpO2 95 %. Body mass index is 22.67 kg/m.   Treatment Plan Summary: Plan encourage patient to use the trazodone if needed.  Continue current dose of mirtazapine.  Encouraged him and praise group participation.  Encouraged him to work with social work on options for discharge.  Mordecai Rasmussen,  MD 11/20/2022, 3:23 PM

## 2022-11-20 NOTE — Progress Notes (Signed)
Pt denies SI/HI/AVH and verbally agrees to approach staff if these become apparent or before harming themselves/others. Rates depression 5/10. Rates anxiety 7/10. Rates pain 0/10.  Pt has been out and in the milieu for the whole day. Pt has attended all groups. Pt is talking to other pts and stated that his day has been okay. Scheduled medications administered to pt, per MD orders. RN provided support and encouragement to pt. Q15 min safety checks implemented and continued. Pt is safe on the unit. Plan of care on going and no other concerns expressed at this time.  11/20/22 0943  Psych Admission Type (Psych Patients Only)  Admission Status Voluntary  Psychosocial Assessment  Patient Complaints Anxiety;Depression  Eye Contact Fair  Facial Expression Worried  Affect Appropriate to circumstance  Speech Logical/coherent  Interaction Assertive  Motor Activity Slow  Appearance/Hygiene Unremarkable  Behavior Characteristics Cooperative  Mood Pleasant  Aggressive Behavior  Effect No apparent injury  Thought Process  Coherency WDL  Content WDL  Delusions None reported or observed  Perception WDL  Hallucination None reported or observed  Judgment WDL  Confusion WDL  Danger to Self  Current suicidal ideation? Passive  Description of Suicide Plan denies at present  Self-Injurious Behavior No self-injurious ideation or behavior indicators observed or expressed   Agreement Not to Harm Self Yes  Description of Agreement verbal  Danger to Others  Danger to Others None reported or observed

## 2022-11-20 NOTE — Group Note (Signed)
Date:  11/20/2022 Time:  4:52 PM  Group Topic/Focus:  Outdoor Recreation/Activity    Participation Level:  Active  Participation Quality:  Appropriate  Affect:  Appropriate  Cognitive:  Appropriate  Insight: Appropriate  Engagement in Group:  Engaged  Modes of Intervention:  Activity  Additional Comments:    Mary Sella Kae Lauman 11/20/2022, 4:52 PM

## 2022-11-21 DIAGNOSIS — F332 Major depressive disorder, recurrent severe without psychotic features: Secondary | ICD-10-CM | POA: Diagnosis not present

## 2022-11-21 NOTE — Progress Notes (Signed)
[facility] MD Progress Note  11/21/2022 8:29 AM Noah Avery  MRN:  [id number] Subjective: Patient slept better for the first time last night.  Reviewed the events that had transpired prior to his admission.  Depression is a 6-7 out of 10, anxiety as a 3-4 out of 10 with 10 being high.  No new medical problems.  He is hoping to be discharged to a boarding house at some point.  Denies have any safety concerns so far today.  Denies any history of psychosis.   Principal Problem: Major depressive disorder, recurrent severe without psychotic features (HCC) Diagnosis: Principal Problem:   Major depressive disorder, recurrent severe without psychotic features (HCC) Active Problems:   Suicidal ideation  Total Time spent with patient: 25 minutes  Past Psychiatric History: Past history of depression   Past Medical History:  Past Medical History:  Diagnosis Date   Depression    H/O degenerative disc disease     Past Surgical History:  Procedure Laterality Date   BACK SURGERY     Family History: History reviewed. No pertinent family history. Family Psychiatric  History: See previous Social History:  Social History   Substance and Sexual Activity  Alcohol Use Yes   Comment: occasional     Social History   Substance and Sexual Activity  Drug Use No    Social History   Socioeconomic History   Marital status: Divorced    Spouse name: Not on file   Number of children: Not on file   Years of education: Not on file   Highest education level: Not on file  Occupational History   Not on file  Tobacco Use   Smoking status: Every Day    Packs/day: 1.00    Years: 15.00    Additional pack years: 0.00    Total pack years: 15.00    Types: Cigarettes    Passive exposure: Current   Smokeless tobacco: Never  Vaping Use   Vaping Use: Never used  Substance and Sexual Activity   Alcohol use: Yes    Comment: occasional   Drug use: No   Sexual activity: Not on file  Other Topics Concern    Not on file  Social History Narrative   Not on file   Social Determinants of Health   Financial Resource Strain: Not on file  Food Insecurity: Food Insecurity Present (11/17/2022)   Hunger Vital Sign    Worried About Running Out of Food in the Last Year: Often true    Ran Out of Food in the Last Year: Often true  Transportation Needs: Unmet Transportation Needs (11/17/2022)   PRAPARE - Administrator, Civil Service (Medical): Yes    Lack of Transportation (Non-Medical): Yes  Physical Activity: Not on file  Stress: Not on file  Social Connections: Not on file   Additional Social History:                         Sleep: Fair  Appetite:  Fair  Current Medications: Current Facility-Administered Medications  Medication Dose Route Frequency Provider Last Rate Last Admin   acetaminophen (TYLENOL) tablet 650 mg  650 mg Oral Q6H PRN Penn, Cicely, NP   650 mg at 11/20/22 1224   alum & mag hydroxide-simeth (MAALOX/MYLANTA) 200-200-20 MG/5ML suspension 30 mL  30 mL Oral Q4H PRN Penn, Cicely, NP       FLUoxetine (PROZAC) capsule 20 mg  20 mg Oral Daily Clapacs, Jackquline Denmark, MD  20 mg at 11/20/22 1610   hydrOXYzine (ATARAX) tablet 25 mg  25 mg Oral TID PRN Mcneil Sober, NP   25 mg at 11/20/22 1729   magnesium hydroxide (MILK OF MAGNESIA) suspension 30 mL  30 mL Oral Daily PRN Penn, Cranston Neighbor, NP       mirtazapine (REMERON) tablet 15 mg  15 mg Oral QHS Clapacs, John T, MD   15 mg at 11/20/22 2201   nicotine (NICODERM CQ - dosed in mg/24 hours) patch 21 mg  21 mg Transdermal Daily Clapacs, Jackquline Denmark, MD   21 mg at 11/20/22 9604   nicotine polacrilex (NICORETTE) gum 2 mg  2 mg Oral PRN Clapacs, Jackquline Denmark, MD       traZODone (DESYREL) tablet 100 mg  100 mg Oral QHS PRN Clapacs, Jackquline Denmark, MD   100 mg at 11/20/22 2201    Lab Results: No results found for this or any previous visit (from the past 48 hour(s)).  Blood Alcohol level:  Lab Results  Component Value Date   ETH <10 11/16/2022    ETH <5 01/04/2016    Metabolic Disorder Labs: Lab Results  Component Value Date   HGBA1C 5.3 02/11/2022   MPG 105.41 02/11/2022   No results found for: "PROLACTIN" Lab Results  Component Value Date   CHOL 155 11/15/2008   TRIG 155.0 (H) 11/15/2008   HDL 31.00 (L) 11/15/2008   CHOLHDL 5 11/15/2008   VLDL 31.0 11/15/2008   LDLCALC 93 11/15/2008    Physical Findings: AIMS: Facial and Oral Movements Muscles of Facial Expression: None, normal Lips and Perioral Area: None, normal Jaw: None, normal Tongue: None, normal,Extremity Movements Upper (arms, wrists, hands, fingers): None, normal Lower (legs, knees, ankles, toes): None, normal, Trunk Movements Neck, shoulders, hips: None, normal, Overall Severity Severity of abnormal movements (highest score from questions above): None, normal Incapacitation due to abnormal movements: None, normal Patient's awareness of abnormal movements (rate only patient's report): No Awareness, Dental Status Current problems with teeth and/or dentures?: No Does patient usually wear dentures?: No  CIWA:    COWS:     Musculoskeletal: Strength & Muscle Tone: within normal limits Gait & Station: normal Patient leans: N/A  Psychiatric Specialty Exam:  Presentation  Casually dressed, but glasses on.  Friendly.  Gait and station within normal limits InSight and judgment: Good d Financial planner Activity:No data recorded     Physical Exam: Physical Exam Vitals and nursing note reviewed.  Constitutional:      Appearance: Normal appearance.  HENT:     Head: Normocephalic and atraumatic.     Mouth/Throat:     Pharynx: Oropharynx is clear.  Eyes:     Pupils: Pupils are equal, round, and reactive to light.  Cardiovascular:     Rate and Rhythm: Normal rate and regular rhythm.  Pulmonary:     Effort: Pulmonary effort is normal.     Breath sounds: Normal breath sounds.  Abdominal:      General: Abdomen is flat.     Palpations: Abdomen is soft.  Musculoskeletal:        General: Normal range of motion.  Skin:    General: Skin is warm and dry.  Neurological:     General: No focal deficit present.     Mental Status: He is alert. Mental status is at baseline.  Psychiatric:        Attention and Perception: Attention normal.        Mood  and Affect: Mood normal.        Speech: Speech normal.        Behavior: Behavior normal.        Thought Content: Thought content normal.        Cognition and Memory: Cognition normal.    Review of Systems  Constitutional: Negative.   HENT: Negative.    Eyes: Negative.   Respiratory: Negative.    Cardiovascular: Negative.   Gastrointestinal: Negative.   Musculoskeletal: Negative.   Skin: Negative.   Neurological: Negative.   Psychiatric/Behavioral:  Negative for depression and suicidal ideas. The patient has insomnia.    Blood pressure (!) 140/92, pulse (!) 103, temperature 98.2 F (36.8 C), temperature source Oral, resp. rate 19, height 5\' 10"  (1.778 m), weight 71.7 kg, SpO2 98 %. Body mass index is 22.67 kg/m.   Treatment Plan Summary: Plan encourage patient to use the trazodone if needed.  Continue current dose of mirtazapine.  Encouraged him and praise group participation.  Encouraged him to work with social work on options for discharge.  5/25 No changes  Reggie Pile, MD 11/21/2022, 8:29 AM

## 2022-11-21 NOTE — Plan of Care (Signed)
D- Patient alert and oriented. Patient presented in an anxious, but pleasant mood on assessment reporting that he slept "fair" last night and had complaints of arm and back pain. Patient rated his pain a "6/10", but did not request any PRN pain medication to help with relief. Patient endorsed hopelessness, depression, and anxiety on his self-inventory, stating that his "current situation". Although patient endorsed SI on his self-inventory, he denied this to this Clinical research associate, stating "none yet today". Patient also denied HI/AVH at this time. Patient had no stated goals for today.  A- Scheduled medications administered to patient, per MD orders. Support and encouragement provided.  Routine safety checks conducted every 15 minutes.  Patient informed to notify staff with problems or concerns.  R- No adverse drug reactions noted. Patient contracts for safety at this time. Patient compliant with medications and treatment plan. Patient receptive, calm, and cooperative. Patient interacts well with others on the unit. Patient remains safe at this time.  Problem: Education: Goal: Knowledge of General Education information will improve Description: Including pain rating scale, medication(s)/side effects and non-pharmacologic comfort measures Outcome: Progressing   Problem: Health Behavior/Discharge Planning: Goal: Ability to manage health-related needs will improve Outcome: Progressing   Problem: Clinical Measurements: Goal: Ability to maintain clinical measurements within normal limits will improve Outcome: Progressing Goal: Will remain free from infection Outcome: Progressing Goal: Diagnostic test results will improve Outcome: Progressing Goal: Respiratory complications will improve Outcome: Progressing Goal: Cardiovascular complication will be avoided Outcome: Progressing   Problem: Activity: Goal: Risk for activity intolerance will decrease Outcome: Progressing   Problem: Nutrition: Goal:  Adequate nutrition will be maintained Outcome: Progressing   Problem: Coping: Goal: Level of anxiety will decrease Outcome: Progressing   Problem: Elimination: Goal: Will not experience complications related to bowel motility Outcome: Progressing Goal: Will not experience complications related to urinary retention Outcome: Progressing   Problem: Pain Managment: Goal: General experience of comfort will improve Outcome: Progressing   Problem: Safety: Goal: Ability to remain free from injury will improve Outcome: Progressing   Problem: Skin Integrity: Goal: Risk for impaired skin integrity will decrease Outcome: Progressing   Problem: Education: Goal: Utilization of techniques to improve thought processes will improve Outcome: Progressing Goal: Knowledge of the prescribed therapeutic regimen will improve Outcome: Progressing   Problem: Activity: Goal: Interest or engagement in leisure activities will improve Outcome: Progressing Goal: Imbalance in normal sleep/wake cycle will improve Outcome: Progressing   Problem: Coping: Goal: Coping ability will improve Outcome: Progressing Goal: Will verbalize feelings Outcome: Progressing   Problem: Health Behavior/Discharge Planning: Goal: Ability to make decisions will improve Outcome: Progressing Goal: Compliance with therapeutic regimen will improve Outcome: Progressing   Problem: Role Relationship: Goal: Will demonstrate positive changes in social behaviors and relationships Outcome: Progressing   Problem: Safety: Goal: Ability to disclose and discuss suicidal ideas will improve Outcome: Progressing Goal: Ability to identify and utilize support systems that promote safety will improve Outcome: Progressing   Problem: Self-Concept: Goal: Will verbalize positive feelings about self Outcome: Progressing Goal: Level of anxiety will decrease Outcome: Progressing   Problem: Education: Goal: Ability to state activities  that reduce stress will improve Outcome: Progressing   Problem: Coping: Goal: Ability to identify and develop effective coping behavior will improve Outcome: Progressing   Problem: Self-Concept: Goal: Ability to identify factors that promote anxiety will improve Outcome: Progressing Goal: Level of anxiety will decrease Outcome: Progressing Goal: Ability to modify response to factors that promote anxiety will improve Outcome: Progressing

## 2022-11-21 NOTE — Group Note (Signed)
[facility] LCSW Group Therapy Note   Group Date: 11/21/2022 Start Time: 1300 End Time: 1400  Type of Therapy and Topic:  Group Therapy:  Feelings around Relapse and Recovery  Participation Level:  Active   Mood: Euthymic   Description of Group:    Patients in this group will discuss emotions they experience before and after a relapse. They will process how experiencing these feelings, or avoidance of experiencing them, relates to having a relapse. Facilitator will guide patients to explore emotions they have related to recovery. Patients will be encouraged to process which emotions are more powerful. They will be guided to discuss the emotional reaction significant others in their lives may have to patients' relapse or recovery. Patients will be assisted in exploring ways to respond to the emotions of others without this contributing to a relapse.  Therapeutic Goals: Patient will identify two or more emotions that lead to relapse for them:  Patient will identify two emotions that result when they relapse:  Patient will identify two emotions related to recovery:  Patient will demonstrate ability to communicate their needs through discussion and/or role plays.   Summary of Patient Progress: Patient was present for the entirety of the group session. Patient was an active listener and participated in the topic of discussion, provided helpful advice to others, and added nuance to topic of conversation. Patient shared he often is triggered by his ex partner and other people in his life causing his mental health symptoms to worsen.    Therapeutic Modalities:   Cognitive Behavioral Therapy Solution-Focused Therapy Assertiveness Training Relapse Prevention Therapy   Corky Crafts, [place]

## 2022-11-21 NOTE — Group Note (Deleted)
[facility] LCSW Group Therapy Note   Group Date: 11/21/2022 Start Time: 1300 End Time: 1400  Type of Therapy and Topic:  Group Therapy:  Feelings around Relapse and Recovery  Participation Level:  {[facility] PARTICIPATION LEVEL:22264}   Mood:  Description of Group:    Patients in this group will discuss emotions they experience before and after a relapse. They will process how experiencing these feelings, or avoidance of experiencing them, relates to having a relapse. Facilitator will guide patients to explore emotions they have related to recovery. Patients will be encouraged to process which emotions are more powerful. They will be guided to discuss the emotional reaction significant others in their lives may have to patients' relapse or recovery. Patients will be assisted in exploring ways to respond to the emotions of others without this contributing to a relapse.  Therapeutic Goals: 1. Patient will identify two or more emotions that lead to relapse for them:  2. Patient will identify two emotions that result when they relapse:  3. Patient will identify two emotions related to recovery:  4. Patient will demonstrate ability to communicate their needs through discussion and/or role plays.   Summary of Patient Progress:  ***   Therapeutic Modalities:   Cognitive Behavioral Therapy Solution-Focused Therapy Assertiveness Training Relapse Prevention Therapy   Journee Bobrowski W Quiana Cobaugh, LCSWA 

## 2022-11-21 NOTE — Progress Notes (Signed)
D - Patient admitted due to symptoms of suicidal thoughts, heightened anxiety, and symptoms of depression.  BP elevated at 1755 and pain denied before bedtime.  Medications accepted as ordered.    A - remeron 15mg  and trazodone 100mg  provided for sleep.  Therapeutic communication given and safety checks maintained.  R - The patient was cooperative and pleasant.  Anxiety managed appropriately.  Suicidal thoughts continue, but are vague and without a plan/intent.  Mood appeared stable and affect was bright.  Patient interacted with peers and watched television.  He fell asleep prior to 2300.

## 2022-11-22 DIAGNOSIS — F332 Major depressive disorder, recurrent severe without psychotic features: Secondary | ICD-10-CM | POA: Diagnosis not present

## 2022-11-22 MED ORDER — NAPROXEN 500 MG PO TABS
500.0000 mg | ORAL_TABLET | Freq: Two times a day (BID) | ORAL | Status: DC | PRN
  Administered 2022-11-22 – 2022-11-25 (×5): 500 mg via ORAL
  Filled 2022-11-22 (×7): qty 1

## 2022-11-22 MED ORDER — LIDOCAINE 5 % EX PTCH
1.0000 | MEDICATED_PATCH | CUTANEOUS | Status: DC
  Administered 2022-11-22 – 2022-11-24 (×3): 1 via TRANSDERMAL
  Filled 2022-11-22 (×5): qty 1

## 2022-11-22 NOTE — Plan of Care (Signed)
  Problem: Education: Goal: Knowledge of General Education information will improve Description: Including pain rating scale, medication(s)/side effects and non-pharmacologic comfort measures Outcome: Progressing   Problem: Activity: Goal: Risk for activity intolerance will decrease Outcome: Progressing   Problem: Nutrition: Goal: Adequate nutrition will be maintained Outcome: Progressing   Problem: Coping: Goal: Level of anxiety will decrease Outcome: Progressing   

## 2022-11-22 NOTE — Group Note (Signed)
Date:  11/22/2022 Time:  6:30 PM  Group Topic/Focus:  Music Therapy     Participation Level:  Active  Participation Quality:  Appropriate  Affect:  Appropriate  Cognitive:  Appropriate  Insight: Appropriate  Engagement in Group:  Engaged  Modes of Intervention:  Socialization  Additional Comments:    Doug Sou 11/22/2022, 6:30 PM

## 2022-11-22 NOTE — Progress Notes (Signed)
Patient alert and oriented x 4 no distress noted, he denies SI/HI/AVH interacting appropriately with peers and staff, compliant with medication and receptive to staff. Patient was offered emotional support,15 minutes safety checks maintained will continue to monitor.

## 2022-11-22 NOTE — Progress Notes (Addendum)
[facility] MD Progress Note  11/22/2022 8:37 AM Noah Avery  MRN:  [id number] Subjective:  5/26 The patient has been calm and cooperative over the past 24 hours.  Reports feeling the same, marginally better.  Denies side effects on his medications.  He has chronic back issues and reports a slight exacerbation due to the beds and furniture on the unit.  He is glad to be on psychiatric medications which he has been off for the past 10 years.  He does not have any's family supports at this time but finds that financial stability will rightly help him.  States that he is very much employable and applied 4 positions at [employer] prior to here.  He is looking into a room and board in town but does not have finances at this time.  Denies suicidal ideations.  5/25 Patient slept better for the first time last night.  Reviewed the events that had transpired prior to his admission.  Depression is a 6-7 out of 10, anxiety as a 3-4 out of 10 with 10 being high.  No new medical problems.  He is hoping to be discharged to a boarding house at some point.  Denies have any safety concerns so far today.  Denies any history of psychosis.   Principal Problem: Major depressive disorder, recurrent severe without psychotic features (HCC) Diagnosis: Principal Problem:   Major depressive disorder, recurrent severe without psychotic features (HCC) Active Problems:   Suicidal ideation  Total Time spent with patient: 27 minutes  Past Psychiatric History: Past history of depression   Past Medical History:  Past Medical History:  Diagnosis Date   Depression    H/O degenerative disc disease     Past Surgical History:  Procedure Laterality Date   BACK SURGERY     Family History: History reviewed. No pertinent family history. Family Psychiatric  History: See previous Social History:  Social History   Substance and Sexual Activity  Alcohol Use Yes   Comment: occasional     Social History   Substance and Sexual  Activity  Drug Use No    Social History   Socioeconomic History   Marital status: Divorced    Spouse name: Not on file   Number of children: Not on file   Years of education: Not on file   Highest education level: Not on file  Occupational History   Not on file  Tobacco Use   Smoking status: Every Day    Packs/day: 1.00    Years: 15.00    Additional pack years: 0.00    Total pack years: 15.00    Types: Cigarettes    Passive exposure: Current   Smokeless tobacco: Never  Vaping Use   Vaping Use: Never used  Substance and Sexual Activity   Alcohol use: Yes    Comment: occasional   Drug use: No   Sexual activity: Not on file  Other Topics Concern   Not on file  Social History Narrative   Not on file   Social Determinants of Health   Financial Resource Strain: Not on file  Food Insecurity: Food Insecurity Present (11/17/2022)   Hunger Vital Sign    Worried About Running Out of Food in the Last Year: Often true    Ran Out of Food in the Last Year: Often true  Transportation Needs: Unmet Transportation Needs (11/17/2022)   PRAPARE - Administrator, Civil Service (Medical): Yes    Lack of Transportation (Non-Medical): Yes  Physical Activity:  Not on file  Stress: Not on file  Social Connections: Not on file   Additional Social History:                         Sleep: Fair  Appetite:  Fair  Current Medications: Current Facility-Administered Medications  Medication Dose Route Frequency Provider Last Rate Last Admin   acetaminophen (TYLENOL) tablet 650 mg  650 mg Oral Q6H PRN Penn, Cranston Neighbor, NP   650 mg at 11/21/22 2128   alum & mag hydroxide-simeth (MAALOX/MYLANTA) 200-200-20 MG/5ML suspension 30 mL  30 mL Oral Q4H PRN Penn, Cranston Neighbor, NP       FLUoxetine (PROZAC) capsule 20 mg  20 mg Oral Daily Clapacs, John T, MD   20 mg at 11/21/22 0848   hydrOXYzine (ATARAX) tablet 25 mg  25 mg Oral TID PRN Mcneil Sober, NP   25 mg at 11/21/22 1832   magnesium  hydroxide (MILK OF MAGNESIA) suspension 30 mL  30 mL Oral Daily PRN Penn, Cranston Neighbor, NP       mirtazapine (REMERON) tablet 15 mg  15 mg Oral QHS Clapacs, John T, MD   15 mg at 11/21/22 2128   nicotine (NICODERM CQ - dosed in mg/24 hours) patch 21 mg  21 mg Transdermal Daily Clapacs, John T, MD   21 mg at 11/21/22 0848   nicotine polacrilex (NICORETTE) gum 2 mg  2 mg Oral PRN Clapacs, Jackquline Denmark, MD       traZODone (DESYREL) tablet 100 mg  100 mg Oral QHS PRN Clapacs, Jackquline Denmark, MD   100 mg at 11/20/22 2201    Lab Results: No results found for this or any previous visit (from the past 48 hour(s)).  Blood Alcohol level:  Lab Results  Component Value Date   ETH <10 11/16/2022   ETH <5 01/04/2016    Metabolic Disorder Labs: Lab Results  Component Value Date   HGBA1C 5.3 02/11/2022   MPG 105.41 02/11/2022   No results found for: "PROLACTIN" Lab Results  Component Value Date   CHOL 155 11/15/2008   TRIG 155.0 (H) 11/15/2008   HDL 31.00 (L) 11/15/2008   CHOLHDL 5 11/15/2008   VLDL 31.0 11/15/2008   LDLCALC 93 11/15/2008    Physical Findings: AIMS: Facial and Oral Movements Muscles of Facial Expression: None, normal Lips and Perioral Area: None, normal Jaw: None, normal Tongue: None, normal,Extremity Movements Upper (arms, wrists, hands, fingers): None, normal Lower (legs, knees, ankles, toes): None, normal, Trunk Movements Neck, shoulders, hips: None, normal, Overall Severity Severity of abnormal movements (highest score from questions above): None, normal Incapacitation due to abnormal movements: None, normal Patient's awareness of abnormal movements (rate only patient's report): No Awareness, Dental Status Current problems with teeth and/or dentures?: No Does patient usually wear dentures?: No  CIWA:    COWS:     Musculoskeletal: Strength & Muscle Tone: within normal limits Gait & Station: normal Patient leans: N/A  Psychiatric Specialty Exam:  Presentation  Casually  dressed, but glasses on.  Friendly.  Gait and station within normal limits Mood: okay Affect- neutral InSight and judgment: Good d Copywriter, advertising Activity  Psychomotor Activity:No data recorded     Physical Exam: Physical Exam Vitals and nursing note reviewed.  Constitutional:      Appearance: Normal appearance.  HENT:     Head: Normocephalic and atraumatic.     Mouth/Throat:     Pharynx: Oropharynx is clear.  Eyes:  Pupils: Pupils are equal, round, and reactive to light.  Cardiovascular:     Rate and Rhythm: Normal rate and regular rhythm.  Pulmonary:     Effort: Pulmonary effort is normal.     Breath sounds: Normal breath sounds.  Abdominal:     General: Abdomen is flat.     Palpations: Abdomen is soft.  Musculoskeletal:        General: Normal range of motion.  Skin:    General: Skin is warm and dry.  Neurological:     General: No focal deficit present.     Mental Status: He is alert. Mental status is at baseline.  Psychiatric:        Attention and Perception: Attention normal.        Mood and Affect: Mood normal.        Speech: Speech normal.        Behavior: Behavior normal.        Thought Content: Thought content normal.        Cognition and Memory: Cognition normal.    Review of Systems  Constitutional: Negative.   HENT: Negative.    Eyes: Negative.   Respiratory: Negative.    Cardiovascular: Negative.   Gastrointestinal: Negative.   Musculoskeletal: Negative.   Skin: Negative.   Neurological: Negative.   Psychiatric/Behavioral:  Negative for depression and suicidal ideas. The patient has insomnia.    Blood pressure (!) 133/98, pulse 91, temperature 97.8 F (36.6 C), temperature source Oral, resp. rate 18, height 5\' 10"  (1.778 m), weight 71.7 kg, SpO2 97 %. Body mass index is 22.67 kg/m.   Treatment Plan Summary: Plan encourage patient to use the trazodone if needed.  Continue current dose of mirtazapine.   Encouraged him and praise group participation.  Encouraged him to work with social work on options for discharge.  5/25 No changes  5/26 Add Lidoderm patch for back Add naproxen 500 mg p.o. twice daily as needed pain Social work to look at housing options Possible discharge beginning to middle of the week  Reggie Pile, MD 11/22/2022, 8:37 AM

## 2022-11-22 NOTE — Progress Notes (Signed)
Pt denies SI/HI/AVH and verbally agrees to approach staff if these become apparent or before harming themselves/others. Rates depression 0/10. Rates anxiety 7/10. Rates pain 8/10.  Scheduled medications administered to pt, per MD orders. RN provided support and encouragement to pt. Q15 min safety checks implemented and continued. Pt is safe on the unit. Plan of care on going and no other concerns expressed at this time.  11/22/22 0852  Psych Admission Type (Psych Patients Only)  Admission Status Voluntary  Psychosocial Assessment  Patient Complaints Anxiety  Eye Contact Fair  Facial Expression Anxious;Animated  Affect Appropriate to circumstance;Anxious  Speech Logical/coherent  Interaction Assertive  Motor Activity Slow  Appearance/Hygiene Unremarkable  Behavior Characteristics Cooperative;Appropriate to situation;Anxious  Mood Anxious;Pleasant  Aggressive Behavior  Effect No apparent injury  Thought Process  Coherency Circumstantial  Content WDL  Delusions None reported or observed  Perception WDL  Hallucination None reported or observed  Judgment WDL  Confusion None  Danger to Self  Current suicidal ideation? Denies  Danger to Others  Danger to Others None reported or observed

## 2022-11-23 DIAGNOSIS — F332 Major depressive disorder, recurrent severe without psychotic features: Secondary | ICD-10-CM | POA: Diagnosis not present

## 2022-11-23 NOTE — Progress Notes (Signed)
[facility] MD Progress Note  11/23/2022 9:07 AM CHANNING ZIRKLE  MRN:  [id number] Subjective: 5/27 Mr. Banken has been calm and cooperative on the unit.  He has been attending groups.  Depression is now moderate as is his anxiety.  He was feeling a bit more anxious about the future and uncertainty last night.  States that he has had a string of bad luck for the past 9 months with his car breaking, child support issues, and then not able to find a job within walking distance.  He applied for jobs at [employer] but now his phone has broken so they are not able to call him.  Denies symptoms of psychosis.  Back pain is less today.  He was able to shoot some basketball yesterday.  Is not able to call boarding homes on Friday but plans to tomorrow.  We discussed that perhaps he can work while he lives there and pay them later, if that is an option.  Denies safety concerns.  No side effects on his medication.  5/26 The patient has been calm and cooperative over the past 24 hours.  Reports feeling the same, marginally better.  Denies side effects on his medications.  He has chronic back issues and reports a slight exacerbation due to the beds and furniture on the unit.  He is glad to be on psychiatric medications which he has been off for the past 10 years.  He does not have any's family supports at this time but finds that financial stability will rightly help him.  States that he is very much employable and applied 4 positions at [employer] prior to here.  He is looking into a room and board in town but does not have finances at this time.  Denies suicidal ideations.  5/25 Patient slept better for the first time last night.  Reviewed the events that had transpired prior to his admission.  Depression is a 6-7 out of 10, anxiety as a 3-4 out of 10 with 10 being high.  No new medical problems.  He is hoping to be discharged to a boarding house at some point.  Denies have any safety concerns so far today.  Denies any history of  psychosis.   Principal Problem: Major depressive disorder, recurrent severe without psychotic features (HCC) Diagnosis: Principal Problem:   Major depressive disorder, recurrent severe without psychotic features (HCC) Active Problems:   Suicidal ideation  Total Time spent with patient: 27 minutes  Past Psychiatric History: Past history of depression   Past Medical History:  Past Medical History:  Diagnosis Date   Depression    H/O degenerative disc disease     Past Surgical History:  Procedure Laterality Date   BACK SURGERY     Family History: History reviewed. No pertinent family history. Family Psychiatric  History: See previous Social History:  Social History   Substance and Sexual Activity  Alcohol Use Yes   Comment: occasional     Social History   Substance and Sexual Activity  Drug Use No    Social History   Socioeconomic History   Marital status: Divorced    Spouse name: Not on file   Number of children: Not on file   Years of education: Not on file   Highest education level: Not on file  Occupational History   Not on file  Tobacco Use   Smoking status: Every Day    Packs/day: 1.00    Years: 15.00    Additional pack years: 0.00  Total pack years: 15.00    Types: Cigarettes    Passive exposure: Current   Smokeless tobacco: Never  Vaping Use   Vaping Use: Never used  Substance and Sexual Activity   Alcohol use: Yes    Comment: occasional   Drug use: No   Sexual activity: Not on file  Other Topics Concern   Not on file  Social History Narrative   Not on file   Social Determinants of Health   Financial Resource Strain: Not on file  Food Insecurity: Food Insecurity Present (11/17/2022)   Hunger Vital Sign    Worried About Running Out of Food in the Last Year: Often true    Ran Out of Food in the Last Year: Often true  Transportation Needs: Unmet Transportation Needs (11/17/2022)   PRAPARE - Administrator, Civil Service  (Medical): Yes    Lack of Transportation (Non-Medical): Yes  Physical Activity: Not on file  Stress: Not on file  Social Connections: Not on file   Additional Social History:                         Sleep: Fair  Appetite:  Fair  Current Medications: Current Facility-Administered Medications  Medication Dose Route Frequency Provider Last Rate Last Admin   acetaminophen (TYLENOL) tablet 650 mg  650 mg Oral Q6H PRN Penn, Cranston Neighbor, NP   650 mg at 11/21/22 2128   alum & mag hydroxide-simeth (MAALOX/MYLANTA) 200-200-20 MG/5ML suspension 30 mL  30 mL Oral Q4H PRN Penn, Cicely, NP       FLUoxetine (PROZAC) capsule 20 mg  20 mg Oral Daily Clapacs, John T, MD   20 mg at 11/23/22 0835   hydrOXYzine (ATARAX) tablet 25 mg  25 mg Oral TID PRN Mcneil Sober, NP   25 mg at 11/22/22 0852   lidocaine (LIDODERM) 5 % 1 patch  1 patch Transdermal Q24H Reggie Pile, MD   1 patch at 11/23/22 1610   magnesium hydroxide (MILK OF MAGNESIA) suspension 30 mL  30 mL Oral Daily PRN Penn, Cranston Neighbor, NP       mirtazapine (REMERON) tablet 15 mg  15 mg Oral QHS Clapacs, John T, MD   15 mg at 11/22/22 2117   naproxen (NAPROSYN) tablet 500 mg  500 mg Oral BID PRN Reggie Pile, MD   500 mg at 11/22/22 2118   nicotine (NICODERM CQ - dosed in mg/24 hours) patch 21 mg  21 mg Transdermal Daily Clapacs, Jackquline Denmark, MD   21 mg at 11/23/22 9604   nicotine polacrilex (NICORETTE) gum 2 mg  2 mg Oral PRN Clapacs, Jackquline Denmark, MD       traZODone (DESYREL) tablet 100 mg  100 mg Oral QHS PRN Clapacs, Jackquline Denmark, MD   100 mg at 11/20/22 2201    Lab Results: No results found for this or any previous visit (from the past 48 hour(s)).  Blood Alcohol level:  Lab Results  Component Value Date   ETH <10 11/16/2022   ETH <5 01/04/2016    Metabolic Disorder Labs: Lab Results  Component Value Date   HGBA1C 5.3 02/11/2022   MPG 105.41 02/11/2022   No results found for: "PROLACTIN" Lab Results  Component Value Date   CHOL 155 11/15/2008    TRIG 155.0 (H) 11/15/2008   HDL 31.00 (L) 11/15/2008   CHOLHDL 5 11/15/2008   VLDL 31.0 11/15/2008   LDLCALC 93 11/15/2008    Physical Findings: AIMS: Facial  and Oral Movements Muscles of Facial Expression: None, normal Lips and Perioral Area: None, normal Jaw: None, normal Tongue: None, normal,Extremity Movements Upper (arms, wrists, hands, fingers): None, normal Lower (legs, knees, ankles, toes): None, normal, Trunk Movements Neck, shoulders, hips: None, normal, Overall Severity Severity of abnormal movements (highest score from questions above): None, normal Incapacitation due to abnormal movements: None, normal Patient's awareness of abnormal movements (rate only patient's report): No Awareness, Dental Status Current problems with teeth and/or dentures?: No Does patient usually wear dentures?: No  CIWA:    COWS:     Musculoskeletal: Strength & Muscle Tone: within normal limits Gait & Station: normal Patient leans: N/A  Psychiatric Specialty Exam:  Presentation  Casually dressed, but glasses on.  Friendly.  Gait and station within normal limits Mood:  better Affect- brighter InSight and judgment: Aeronautical engineer Activity  Psychomotor Activity:No data recorded     Physical Exam: Physical Exam Vitals and nursing note reviewed.  Constitutional:      Appearance: Normal appearance.  HENT:     Head: Normocephalic and atraumatic.     Mouth/Throat:     Pharynx: Oropharynx is clear.  Eyes:     Pupils: Pupils are equal, round, and reactive to light.  Cardiovascular:     Rate and Rhythm: Normal rate and regular rhythm.  Pulmonary:     Effort: Pulmonary effort is normal.     Breath sounds: Normal breath sounds.  Abdominal:     General: Abdomen is flat.     Palpations: Abdomen is soft.  Musculoskeletal:        General: Normal range of motion.  Skin:    General: Skin is warm and dry.  Neurological:     General: No focal  deficit present.     Mental Status: He is alert. Mental status is at baseline.  Psychiatric:        Attention and Perception: Attention normal.        Mood and Affect: Mood normal.        Speech: Speech normal.        Behavior: Behavior normal.        Thought Content: Thought content normal.        Cognition and Memory: Cognition normal.    Review of Systems  Constitutional: Negative.   HENT: Negative.    Eyes: Negative.   Respiratory: Negative.    Cardiovascular: Negative.   Gastrointestinal: Negative.   Musculoskeletal: Negative.   Skin: Negative.   Neurological: Negative.   Psychiatric/Behavioral:  Negative for depression and suicidal ideas. The patient has insomnia.    Blood pressure 121/89, pulse 81, temperature 97.9 F (36.6 C), temperature source Oral, resp. rate 18, height 5\' 10"  (1.778 m), weight 71.7 kg, SpO2 100 %. Body mass index is 22.67 kg/m.   Treatment Plan Summary: Plan encourage patient to use the trazodone if needed.  Continue current dose of mirtazapine.  Encouraged him and praise group participation.  Encouraged him to work with social work on options for discharge.  5/25 No changes  5/26 Add Lidoderm patch for back Add naproxen 500 mg p.o. twice daily as needed pain Social work to look at housing options Possible discharge beginning to middle of the week  5/27 No changes  [id number]  Reggie Pile, MD 11/23/2022, 9:07 AM

## 2022-11-23 NOTE — Plan of Care (Signed)
  Problem: Education: Goal: Knowledge of General Education information will improve Description Including pain rating scale, medication(s)/side effects and non-pharmacologic comfort measures Outcome: Progressing   Problem: Health Behavior/Discharge Planning: Goal: Ability to manage health-related needs will improve Outcome: Progressing   

## 2022-11-23 NOTE — Group Note (Signed)
Date:  11/23/2022 Time:  3:50 PM  Group Topic/Focus:  Activity Group    Participation Level:  Active  Participation Quality:  Appropriate  Affect:  Appropriate  Cognitive:  Appropriate  Insight: Appropriate  Engagement in Group:  Engaged  Modes of Intervention:  Activity  Additional Comments:    Noah Avery M Noah Avery 11/23/2022, 3:50 PM  

## 2022-11-23 NOTE — [facility] Group Notes (Signed)
[facility] Group Notes:  (Nursing/MHT/Case Management/Adjunct)  Date:  11/23/2022  Time:  1:10 PM  Type of Therapy:  Psychoeducational Skills  Participation Level:  Active  Participation Quality:  Appropriate  Affect:  Appropriate  Cognitive:  Appropriate  Insight:  Good  Engagement in Group:  Engaged  Modes of Intervention:  Discussion  Summary of Progress/Problems:  Noah Avery 11/23/2022, 1:10 PM

## 2022-11-23 NOTE — Progress Notes (Signed)
D - Patient watched movies and socialized during the evening hours.  Mood was bright and anxiety appeared well-managed.  The patient denied symptoms of withdrawal and he accepted his nighttime medications as ordered.  Overall improvement in functioning observed.  Noah Avery denied suicidal thoughts.    Chronic back pain scored between 3 and 6, as the patient had his lidocaine patch fall off while playing basketball earlier in the evening.    A - Mirtazapine provided for sleep.  Encouragement provided and improvements discussed.  Safety checks maintained.  Patient instructed that he should inform his nurse about pain patch problems.    R - Patient slept from 2200 until the morning, voicing no concerns or complaints.  No unsafe behaviors noted.  Will continue to provided support and monitor for safety.

## 2022-11-23 NOTE — Group Note (Signed)
LCSW Group Therapy Note  Group Date: 11/23/2022 Start Time: 1300 End Time: 1400   Type of Therapy and Topic:  Group Therapy - How To Cope with Nervousness about Discharge   Participation Level:  Active   Description of Group This process group involved identification of patients' feelings about discharge. Some of them are scheduled to be discharged soon, while others are new admissions, but each of them was asked to share thoughts and feelings surrounding discharge from the hospital. One common theme was that they are excited at the prospect of going home, while another was that many of them are apprehensive about sharing why they were hospitalized. Patients were given the opportunity to discuss these feelings with their peers in preparation for discharge.  Therapeutic Goals  Patient will identify their overall feelings about pending discharge. Patient will think about how they might proactively address issues that they believe will once again arise once they get home (i.e. with parents). Patients will participate in discussion about having hope for change.   Summary of Patient Progress:   Patient was present for the entirety of the group session. Patient was an active listener and participated in the topic of discussion, provided helpful advice to others, and added nuance to topic of conversation. Patient shared he is apprehensive at the prospect of discharging from the hospital.    Therapeutic Modalities Cognitive Behavioral Therapy   Almedia Balls 11/23/2022  10:46 PM

## 2022-11-23 NOTE — Group Note (Signed)
Date:  11/23/2022 Time:  8:44 PM  Group Topic/Focus:  Wrap-Up Group:   The focus of this group is to help patients review their daily goal of treatment and discuss progress on daily workbooks.    Participation Level:  Active  Participation Quality:  Appropriate and Attentive  Affect:  Appropriate and Excited  Cognitive:  Alert and Appropriate  Insight: Appropriate and Good  Engagement in Group:  Developing/Improving and Engaged  Modes of Intervention:  Clarification, Discussion, Education, Socialization, and Support  Additional Comments:     Shirlie Enck 11/23/2022, 8:44 PM

## 2022-11-23 NOTE — BH IP Treatment Plan (Signed)
Interdisciplinary Treatment and Diagnostic Plan Update  11/23/2022 Time of Session: 0830 Noah Avery MRN: [id number]  Principal Diagnosis: Major depressive disorder, recurrent severe without psychotic features (HCC)  Secondary Diagnoses: Principal Problem:   Major depressive disorder, recurrent severe without psychotic features (HCC) Active Problems:   Suicidal ideation   Current Medications:  Current Facility-Administered Medications  Medication Dose Route Frequency Provider Last Rate Last Admin   acetaminophen (TYLENOL) tablet 650 mg  650 mg Oral Q6H PRN Penn, Cranston Neighbor, NP   650 mg at 11/21/22 2128   alum & mag hydroxide-simeth (MAALOX/MYLANTA) 200-200-20 MG/5ML suspension 30 mL  30 mL Oral Q4H PRN Penn, Cranston Neighbor, NP       FLUoxetine (PROZAC) capsule 20 mg  20 mg Oral Daily Clapacs, John T, MD   20 mg at 11/23/22 8657   hydrOXYzine (ATARAX) tablet 25 mg  25 mg Oral TID PRN Mcneil Sober, NP   25 mg at 11/23/22 2148   lidocaine (LIDODERM) 5 % 1 patch  1 patch Transdermal Q24H Reggie Pile, MD   1 patch at 11/23/22 8469   magnesium hydroxide (MILK OF MAGNESIA) suspension 30 mL  30 mL Oral Daily PRN Penn, Cranston Neighbor, NP       mirtazapine (REMERON) tablet 15 mg  15 mg Oral QHS Clapacs, John T, MD   15 mg at 11/23/22 2149   naproxen (NAPROSYN) tablet 500 mg  500 mg Oral BID PRN Reggie Pile, MD   500 mg at 11/23/22 1300   nicotine (NICODERM CQ - dosed in mg/24 hours) patch 21 mg  21 mg Transdermal Daily Clapacs, Jackquline Denmark, MD   21 mg at 11/23/22 6295   nicotine polacrilex (NICORETTE) gum 2 mg  2 mg Oral PRN Clapacs, Jackquline Denmark, MD       traZODone (DESYREL) tablet 100 mg  100 mg Oral QHS PRN Clapacs, Jackquline Denmark, MD   100 mg at 11/20/22 2201   PTA Medications: No medications prior to admission.    Patient Stressors: Financial difficulties   Loss of relationships and job   Marital or family conflict   Occupational concerns   Traumatic event    Patient Strengths: Ability for insight  Average or above  average intelligence  Capable of independent living  Forensic psychologist fund of knowledge  Motivation for treatment/growth  Religious Affiliation  Work skills   Treatment Modalities: Medication Management, Group therapy, Case management,  1 to 1 session with clinician, Psychoeducation, Recreational therapy.   Physician Treatment Plan for Primary Diagnosis: Major depressive disorder, recurrent severe without psychotic features (HCC) Long Term Goal(s): Improvement in symptoms so as ready for discharge   Short Term Goals: Compliance with prescribed medications will improve Ability to verbalize feelings will improve Ability to disclose and discuss suicidal ideas  Medication Management: Evaluate patient's response, side effects, and tolerance of medication regimen.  Therapeutic Interventions: 1 to 1 sessions, Unit Group sessions and Medication administration.  Evaluation of Outcomes: Progressing  Physician Treatment Plan for Secondary Diagnosis: Principal Problem:   Major depressive disorder, recurrent severe without psychotic features (HCC) Active Problems:   Suicidal ideation  Long Term Goal(s): Improvement in symptoms so as ready for discharge   Short Term Goals: Compliance with prescribed medications will improve Ability to verbalize feelings will improve Ability to disclose and discuss suicidal ideas     Medication Management: Evaluate patient's response, side effects, and tolerance of medication regimen.  Therapeutic Interventions: 1 to 1 sessions, Unit Group sessions and Medication administration.  Evaluation  of Outcomes: Progressing   RN Treatment Plan for Primary Diagnosis: Major depressive disorder, recurrent severe without psychotic features (HCC) Long Term Goal(s): Knowledge of disease and therapeutic regimen to maintain health will improve  Short Term Goals: Ability to remain free from injury will improve, Ability to verbalize frustration and anger  appropriately will improve, Ability to demonstrate self-control, Ability to participate in decision making will improve, Ability to verbalize feelings will improve, Ability to disclose and discuss suicidal ideas, Ability to identify and develop effective coping behaviors will improve, and Compliance with prescribed medications will improve  Medication Management: RN will administer medications as ordered by provider, will assess and evaluate patient's response and provide education to patient for prescribed medication. RN will report any adverse and/or side effects to prescribing provider.  Therapeutic Interventions: 1 on 1 counseling sessions, Psychoeducation, Medication administration, Evaluate responses to treatment, Monitor vital signs and CBGs as ordered, Perform/monitor CIWA, COWS, AIMS and Fall Risk screenings as ordered, Perform wound care treatments as ordered.  Evaluation of Outcomes: Progressing   LCSW Treatment Plan for Primary Diagnosis: Major depressive disorder, recurrent severe without psychotic features (HCC) Long Term Goal(s): Safe transition to appropriate next level of care at discharge, Engage patient in therapeutic group addressing interpersonal concerns.  Short Term Goals: Engage patient in aftercare planning with referrals and resources, Increase social support, Increase ability to appropriately verbalize feelings, Increase emotional regulation, Facilitate acceptance of mental health diagnosis and concerns, Facilitate patient progression through stages of change regarding substance use diagnoses and concerns, Identify triggers associated with mental health/substance abuse issues, and Increase skills for wellness and recovery  Therapeutic Interventions: Assess for all discharge needs, 1 to 1 time with Social worker, Explore available resources and support systems, Assess for adequacy in community support network, Educate family and significant other(s) on suicide prevention,  Complete Psychosocial Assessment, Interpersonal group therapy.  Evaluation of Outcomes: Progressing   Progress in Treatment: Attending groups: Yes. Participating in groups: Yes. Taking medication as prescribed: Yes. Toleration medication: Yes. Family/Significant other contact made: No, will contact:  CSW has attempted to reach Rocky Link Smith/pastor [phone]) Patient understands diagnosis: Yes. Discussing patient identified problems/goals with staff: Yes. Medical problems stabilized or resolved: Yes. Denies suicidal/homicidal ideation: No. Issues/concerns per patient self-inventory: Yes. Other: none  New problem(s) identified: No, Describe:  none  New Short Term/Long Term Goal(s): Patient to work towards detox, medication management for mood stabilization; elimination of SI thoughts; development of comprehensive mental wellness/sobriety plan.  Patient Goals:  No additional goals identified at this time. Patient to continue to work towards original goals identified in initial treatment team meeting. CSW will remain available to patient should they voice additional treatment goals.   Discharge Plan or Barriers: No psychosocial barriers identified at this time, patient to return to place of residence when appropriate for discharge.   Reason for Continuation of Hospitalization: Depression Medication stabilization  Estimated Length of Stay: 1-7 days   Last 3 [place] Suicide Severity Risk Score: Flowsheet Row Admission (Current) from 11/17/2022 in [facility] INPATIENT BEHAVIORAL MEDICINE Most recent reading at 11/20/2022  9:33 AM ED from 11/17/2022 in [facility] Emergency Department at [facility] Most recent reading at 11/16/2022  6:40 PM ED from 02/11/2022 in [facility] Emergency Department at [facility] Most recent reading at 02/11/2022  6:36 PM  C-SSRS RISK CATEGORY No Risk High Risk No Risk       Scribe for Treatment Team: Almedia Balls 11/23/2022 10:31 PM

## 2022-11-24 DIAGNOSIS — F332 Major depressive disorder, recurrent severe without psychotic features: Secondary | ICD-10-CM | POA: Diagnosis not present

## 2022-11-24 MED ORDER — METHOCARBAMOL 500 MG PO TABS
750.0000 mg | ORAL_TABLET | Freq: Four times a day (QID) | ORAL | Status: DC | PRN
  Administered 2022-11-24 – 2022-11-30 (×12): 750 mg via ORAL
  Filled 2022-11-24 (×11): qty 2

## 2022-11-24 NOTE — Progress Notes (Addendum)
I assumed care for Mr Noah Avery at about 08:00.  He was resting in his bed, later seen after breakfast and am med pass. Denied any avh/hi/si during my assessment, requested for and received prn hydroxyzine for anxiety, effective on follow up. He also received prn Naproxen for generalized pain, effective on follow up. He attended all am groups and participated appropriately. He is being monitored as ordered.   No changes in condition from baseline,he received prn Robaxin this evening,being monitored as ordered.

## 2022-11-24 NOTE — Group Note (Signed)
[facility] LCSW Group Therapy Note   Group Date: 11/24/2022 Start Time: 1310 End Time: 1420  Type of Therapy/Topic:  Group Therapy:  Feelings about Diagnosis  Participation Level:  Active     Description of Group:    This group will allow patients to explore their thoughts and feelings about diagnoses they have received. Patients will be guided to explore their level of understanding and acceptance of these diagnoses. Facilitator will encourage patients to process their thoughts and feelings about the reactions of others to their diagnosis, and will guide patients in identifying ways to discuss their diagnosis with significant others in their lives. This group will be process-oriented, with patients participating in exploration of their own experiences as well as giving and receiving support and challenge from other group members.   Therapeutic Goals: 1. Patient will demonstrate understanding of diagnosis as evidence by identifying two or more symptoms of the disorder:  2. Patient will be able to express two feelings regarding the diagnosis 3. Patient will demonstrate ability to communicate their needs through discussion and/or role plays  Summary of Patient Progress: Patient was present for the entirety of the group process. He defined diagnosis as a label of what is wrong with someone. 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 He went on to shared that physical health diagnoses and mental health diagnoses often impact/influence each other. Pt used himself as an example and spoke about knowing that he had AD/HD all his life. He went on to shared that after injuring his back at work, his entire life changed and that's when the depression came. Pt appears to be open and receptive to feedback/comments from both peers and facilitator. He appears to have some insight into the topic.   Therapeutic Modalities:   Cognitive Behavioral Therapy Brief  Therapy Feelings Identification    Glenis Smoker, LCSW

## 2022-11-24 NOTE — Group Note (Signed)
Recreation Therapy Group Note   Group Topic:Goal Setting  Group Date: 11/24/2022 Start Time: 1000 End Time: 1105 Facilitators: Rosina Lowenstein, LRT, CTRS Location:  Craft Room  Group Description: Vision Board. Patients were given many different magazines, a glue stick, markers, and a piece of cardstock paper. LRT and pts discussed the importance of having goals in life. LRT and pts discussed the difference between short-term and long-term goals, as well as what a SMART goal is. LRT encouraged pts to create a vision board, with images they picked and then cut out with safety scissors from the magazine, for themselves, that capture their short and long-term goals. LRT encouraged pts to show and explain their vision board to the group. LRT offered to laminate vision board once dry and complete.   Goal Area(s) Addressed:  Patient will gain knowledge of short vs. long term goals.  Patient will identify goals for themselves. Patient will practice setting SMART goals. Patient will verbalize their goals to LRT and peers.  Affect/Mood: Appropriate   Participation Level: Active and Engaged   Participation Quality: Independent   Behavior: Appropriate and Cooperative   Speech/Thought Process: Coherent   Insight: Good   Judgement: Good   Modes of Intervention: Activity and Guided Discussion   Patient Response to Interventions:  Attentive, Engaged, Interested , and Receptive   Education Outcome:  Acknowledges education   Clinical Observations/Individualized Feedback: Noah Avery was active in their participation of session activities and group discussion. Pt identified "I want to wake up everyday and I want to challenge myself everyday" as his goals. Pt asked for his vision board to be laminated after group. Pt interacted well with LRT and peers duration of session.    Plan: Continue to engage patient in RT group sessions 2-3x/week.   Rosina Lowenstein, LRT, CTRS 11/24/2022 11:46 AM

## 2022-11-24 NOTE — Progress Notes (Signed)
D - Patient visible in the milieu and compliant with ordered medications.  Mood described as moderately depressed and anxious. Patient continues to endorse chronic lower back pain.    A - Lidocaine patch removed and applied as ordered over a 24 hour period.  Hydroxyzine administered for anxiety.  Trazodone administered for sleep.  Therapeutic communication and comfort provided.  R - Patient denied thoughts of self harm.  No unsafe behaviors.  PRN medications effective.  No significant changes to note.  The patient fell asleep before 2300 and slept until morning.

## 2022-11-24 NOTE — Group Note (Signed)
Date:  11/24/2022 Time:  9:01 PM  Group Topic/Focus:  Wrap-Up Group:   The focus of this group is to help patients review their daily goal of treatment and discuss progress on daily workbooks.    Participation Level:  Active  Participation Quality:  Appropriate and Attentive  Affect:  Appropriate  Cognitive:  Alert and Appropriate  Insight: Good  Engagement in Group:  Developing/Improving  Modes of Intervention:  Discussion  Additional Comments:     Morningstar Toft 11/24/2022, 9:01 PM

## 2022-11-24 NOTE — Progress Notes (Signed)
Patient pleasant and cooperative. Complains of anxiety and insomnia, prns given. Noted in dayroom watching tv with peers. Minimal interaction. No complaints of pain thus far. Encouragement and support provided. Safety checks maintained. Medications given as prescribed. Pt receptive and remains safe on unit with q 15 min checks.

## 2022-11-24 NOTE — Progress Notes (Signed)
[facility] MD Progress Note  11/24/2022 2:43 PM Noah Avery  MRN:  [id number] Subjective: Follow-up for this 52 year old man with depression and anxiety.  Mood stable.  No suicidal thoughts.  Continues to mostly complain of muscle pain. Principal Problem: Major depressive disorder, recurrent severe without psychotic features (HCC) Diagnosis: Principal Problem:   Major depressive disorder, recurrent severe without psychotic features (HCC) Active Problems:   Suicidal ideation  Total Time spent with patient: 20 minutes  Past Psychiatric History: Past history of depression and anxiety  Past Medical History:  Past Medical History:  Diagnosis Date   Depression    H/O degenerative disc disease     Past Surgical History:  Procedure Laterality Date   BACK SURGERY     Family History: History reviewed. No pertinent family history. Family Psychiatric  History: See previous Social History:  Social History   Substance and Sexual Activity  Alcohol Use Yes   Comment: occasional     Social History   Substance and Sexual Activity  Drug Use No    Social History   Socioeconomic History   Marital status: Divorced    Spouse name: Not on file   Number of children: Not on file   Years of education: Not on file   Highest education level: Not on file  Occupational History   Not on file  Tobacco Use   Smoking status: Every Day    Packs/day: 1.00    Years: 15.00    Additional pack years: 0.00    Total pack years: 15.00    Types: Cigarettes    Passive exposure: Current   Smokeless tobacco: Never  Vaping Use   Vaping Use: Never used  Substance and Sexual Activity   Alcohol use: Yes    Comment: occasional   Drug use: No   Sexual activity: Not on file  Other Topics Concern   Not on file  Social History Narrative   Not on file   Social Determinants of Health   Financial Resource Strain: Not on file  Food Insecurity: Food Insecurity Present (11/17/2022)   Hunger Vital Sign     Worried About Running Out of Food in the Last Year: Often true    Ran Out of Food in the Last Year: Often true  Transportation Needs: Unmet Transportation Needs (11/17/2022)   PRAPARE - Administrator, Civil Service (Medical): Yes    Lack of Transportation (Non-Medical): Yes  Physical Activity: Not on file  Stress: Not on file  Social Connections: Not on file   Additional Social History:                         Sleep: Fair  Appetite:  Fair  Current Medications: Current Facility-Administered Medications  Medication Dose Route Frequency Provider Last Rate Last Admin   acetaminophen (TYLENOL) tablet 650 mg  650 mg Oral Q6H PRN Penn, Cranston Neighbor, NP   650 mg at 11/21/22 2128   alum & mag hydroxide-simeth (MAALOX/MYLANTA) 200-200-20 MG/5ML suspension 30 mL  30 mL Oral Q4H PRN Penn, Cicely, NP       FLUoxetine (PROZAC) capsule 20 mg  20 mg Oral Daily Pegi Milazzo T, MD   20 mg at 11/24/22 4098   hydrOXYzine (ATARAX) tablet 25 mg  25 mg Oral TID PRN Mcneil Sober, NP   25 mg at 11/24/22 1440   lidocaine (LIDODERM) 5 % 1 patch  1 patch Transdermal Q24H Reggie Pile, MD   1 patch  at 11/24/22 0853   magnesium hydroxide (MILK OF MAGNESIA) suspension 30 mL  30 mL Oral Daily PRN Penn, Cranston Neighbor, NP       methocarbamol (ROBAXIN) tablet 750 mg  750 mg Oral Q6H PRN Daisey Caloca T, MD       mirtazapine (REMERON) tablet 15 mg  15 mg Oral QHS Vinson Tietze T, MD   15 mg at 11/23/22 2149   naproxen (NAPROSYN) tablet 500 mg  500 mg Oral BID PRN Reggie Pile, MD   500 mg at 11/24/22 1111   nicotine (NICODERM CQ - dosed in mg/24 hours) patch 21 mg  21 mg Transdermal Daily Jajuan Skoog, Jackquline Denmark, MD   21 mg at 11/24/22 1610   nicotine polacrilex (NICORETTE) gum 2 mg  2 mg Oral PRN Shy Guallpa, Jackquline Denmark, MD       traZODone (DESYREL) tablet 100 mg  100 mg Oral QHS PRN Teofil Maniaci, Jackquline Denmark, MD   100 mg at 11/20/22 2201    Lab Results: No results found for this or any previous visit (from the past 48  hour(s)).  Blood Alcohol level:  Lab Results  Component Value Date   ETH <10 11/16/2022   ETH <5 01/04/2016    Metabolic Disorder Labs: Lab Results  Component Value Date   HGBA1C 5.3 02/11/2022   MPG 105.41 02/11/2022   No results found for: "PROLACTIN" Lab Results  Component Value Date   CHOL 155 11/15/2008   TRIG 155.0 (H) 11/15/2008   HDL 31.00 (L) 11/15/2008   CHOLHDL 5 11/15/2008   VLDL 31.0 11/15/2008   LDLCALC 93 11/15/2008    Physical Findings: AIMS: Facial and Oral Movements Muscles of Facial Expression: None, normal Lips and Perioral Area: None, normal Jaw: None, normal Tongue: None, normal,Extremity Movements Upper (arms, wrists, hands, fingers): None, normal Lower (legs, knees, ankles, toes): None, normal, Trunk Movements Neck, shoulders, hips: None, normal, Overall Severity Severity of abnormal movements (highest score from questions above): None, normal Incapacitation due to abnormal movements: None, normal Patient's awareness of abnormal movements (rate only patient's report): No Awareness, Dental Status Current problems with teeth and/or dentures?: No Does patient usually wear dentures?: No  CIWA:    COWS:     Musculoskeletal: Strength & Muscle Tone: within normal limits Gait & Station: normal Patient leans: N/A  Psychiatric Specialty Exam:  Presentation  General Appearance: No data recorded Eye Contact:No data recorded Speech:No data recorded Speech Volume:No data recorded Handedness:No data recorded  Mood and Affect  Mood:No data recorded Affect:No data recorded  Thought Process  Thought Processes:No data recorded Descriptions of Associations:No data recorded Orientation:No data recorded Thought Content:No data recorded History of Schizophrenia/Schizoaffective disorder:No  Duration of Psychotic Symptoms:No data recorded Hallucinations:No data recorded Ideas of Reference:No data recorded Suicidal Thoughts:No data  recorded Homicidal Thoughts:No data recorded  Sensorium  Memory:No data recorded Judgment:No data recorded Insight:No data recorded  Executive Functions  Concentration:No data recorded Attention Span:No data recorded Recall:No data recorded Fund of Knowledge:No data recorded Language:No data recorded  Psychomotor Activity  Psychomotor Activity:No data recorded  Assets  Assets:No data recorded  Sleep  Sleep:No data recorded   Physical Exam: Physical Exam Constitutional:      Appearance: Normal appearance.  HENT:     Head: Normocephalic and atraumatic.     Mouth/Throat:     Pharynx: Oropharynx is clear.  Eyes:     Pupils: Pupils are equal, round, and reactive to light.  Cardiovascular:     Rate and Rhythm: Normal rate and  regular rhythm.  Pulmonary:     Effort: Pulmonary effort is normal.     Breath sounds: Normal breath sounds.  Abdominal:     General: Abdomen is flat.     Palpations: Abdomen is soft.  Musculoskeletal:        General: Tenderness present. Normal range of motion.  Skin:    General: Skin is warm and dry.  Neurological:     General: No focal deficit present.     Mental Status: He is alert. Mental status is at baseline.  Psychiatric:        Attention and Perception: Attention normal.        Mood and Affect: Mood normal.        Speech: Speech normal.        Behavior: Behavior normal.        Thought Content: Thought content normal.        Cognition and Memory: Cognition normal.    Review of Systems  Constitutional: Negative.   HENT: Negative.    Eyes: Negative.   Respiratory: Negative.    Cardiovascular: Negative.   Gastrointestinal: Negative.   Musculoskeletal: Negative.   Skin: Negative.   Neurological: Negative.   Psychiatric/Behavioral: Negative.     Blood pressure 120/87, pulse 66, temperature 97.9 F (36.6 C), temperature source Oral, resp. rate 18, height 5\' 10"  (1.778 m), weight 71.7 kg, SpO2 98 %. Body mass index is 22.67  kg/m.   Treatment Plan Summary: Plan patient requested muscle relaxer.  Order placed for Robaxin.  No other change to medicine.  Encourage him to talk with staff about discharge planning  Mordecai Rasmussen, MD 11/24/2022, 2:43 PM

## 2022-11-24 NOTE — Group Note (Signed)
Date:  11/24/2022 Time:  10:06 AM  Group Topic/Focus:  Goals Group:   The focus of this group is to help patients establish daily goals to achieve during treatment and discuss how the patient can incorporate goal setting into their daily lives to aide in recovery.    Participation Level:  Active  Participation Quality:  Appropriate  Affect:  Appropriate  Cognitive:  Appropriate  Insight: Appropriate  Engagement in Group:  Engaged  Modes of Intervention:  Discussion, Education, and Support  Additional Comments:    Riana Tessmer Travis Sarabeth Benton 11/24/2022, 10:06 AM  

## 2022-11-24 NOTE — Group Note (Signed)
Date:  11/24/2022 Time:  5:40 PM  Group Topic/Focus:  Activity Group    Participation Level:  Active  Participation Quality:  Appropriate  Affect:  Appropriate  Cognitive:  Appropriate  Insight: Good  Engagement in Group:  Engaged  Modes of Intervention:  Activity  Additional Comments:    Noah Avery 11/24/2022, 5:40 PM

## 2022-11-25 DIAGNOSIS — F332 Major depressive disorder, recurrent severe without psychotic features: Secondary | ICD-10-CM | POA: Diagnosis not present

## 2022-11-25 NOTE — Group Note (Signed)
Recreation Therapy Group Note   Group Topic:Relaxation  Group Date: 11/25/2022 Start Time: 1000 End Time: 1045 Facilitators: Rosina Lowenstein, LRT, CTRS Location:  [facility]  Group Description: Meditation. LRT asks patients their current level of stress/anxiety from 1-10, with 10 being the highest. LRT educated on the benefits of mindfulness and how it can apply to everyday life post-discharge. LRT and pt's followed along to an audio script of a "guided meditation" video. LRT asked pt their level of stress and anxiety once the prompt was finished. LRT facilitated post-activity processing to gain feedback on session.  Goal Area(s) Addressed:  Patient will practice using relaxation technique. Patient will identify a new coping skill.  Patient will follow multistep directions to reduce anxiety and stress.  Affect/Mood: Appropriate   Participation Level: Active and Engaged   Participation Quality: Independent   Behavior: Calm and Cooperative   Speech/Thought Process: Coherent   Insight: Good   Judgement: Good   Modes of Intervention: Activity   Patient Response to Interventions:  Attentive, Engaged, Interested , and Receptive   Education Outcome:  Acknowledges education   Clinical Observations/Individualized Feedback: Noah Avery was active in their participation of session activities and group discussion. Pt identified that his anxiety level was a 6 and stress was a 7 before the session. Afterwards, he rated his anxiety a 7 and his stress a 4. Pt interacted well with LRT and peers duration of session.   Plan: Continue to engage patient in RT group sessions 2-3x/week.   Rosina Lowenstein, LRT, CTRS 11/25/2022 11:04 AM

## 2022-11-25 NOTE — Progress Notes (Signed)
[facility] MD Progress Note  11/25/2022 4:49 PM Noah Avery  MRN:  [id number] Subjective: No new complaints.  Chronic mild pain. Principal Problem: Major depressive disorder, recurrent severe without psychotic features (HCC) Diagnosis: Principal Problem:   Major depressive disorder, recurrent severe without psychotic features (HCC) Active Problems:   Suicidal ideation  Total Time spent with patient: 15 minutes  Past Psychiatric History: History of depression  Past Medical History:  Past Medical History:  Diagnosis Date   Depression    H/O degenerative disc disease     Past Surgical History:  Procedure Laterality Date   BACK SURGERY     Family History: History reviewed. No pertinent family history. Family Psychiatric  History: See previous Social History:  Social History   Substance and Sexual Activity  Alcohol Use Yes   Comment: occasional     Social History   Substance and Sexual Activity  Drug Use No    Social History   Socioeconomic History   Marital status: Divorced    Spouse name: Not on file   Number of children: Not on file   Years of education: Not on file   Highest education level: Not on file  Occupational History   Not on file  Tobacco Use   Smoking status: Every Day    Packs/day: 1.00    Years: 15.00    Additional pack years: 0.00    Total pack years: 15.00    Types: Cigarettes    Passive exposure: Current   Smokeless tobacco: Never  Vaping Use   Vaping Use: Never used  Substance and Sexual Activity   Alcohol use: Yes    Comment: occasional   Drug use: No   Sexual activity: Not on file  Other Topics Concern   Not on file  Social History Narrative   Not on file   Social Determinants of Health   Financial Resource Strain: Not on file  Food Insecurity: Food Insecurity Present (11/17/2022)   Hunger Vital Sign    Worried About Running Out of Food in the Last Year: Often true    Ran Out of Food in the Last Year: Often true  Transportation  Needs: Unmet Transportation Needs (11/17/2022)   PRAPARE - Administrator, Civil Service (Medical): Yes    Lack of Transportation (Non-Medical): Yes  Physical Activity: Not on file  Stress: Not on file  Social Connections: Not on file   Additional Social History:                         Sleep: Fair  Appetite:  Fair  Current Medications: Current Facility-Administered Medications  Medication Dose Route Frequency Provider Last Rate Last Admin   acetaminophen (TYLENOL) tablet 650 mg  650 mg Oral Q6H PRN Penn, Cranston Neighbor, NP   650 mg at 11/21/22 2128   alum & mag hydroxide-simeth (MAALOX/MYLANTA) 200-200-20 MG/5ML suspension 30 mL  30 mL Oral Q4H PRN Penn, Cicely, NP       FLUoxetine (PROZAC) capsule 20 mg  20 mg Oral Daily Taralyn Ferraiolo T, MD   20 mg at 11/25/22 0854   hydrOXYzine (ATARAX) tablet 25 mg  25 mg Oral TID PRN Mcneil Sober, NP   25 mg at 11/25/22 0857   lidocaine (LIDODERM) 5 % 1 patch  1 patch Transdermal Q24H Reggie Pile, MD   1 patch at 11/24/22 0853   magnesium hydroxide (MILK OF MAGNESIA) suspension 30 mL  30 mL Oral Daily PRN Penn,  Cranston Neighbor, NP       methocarbamol (ROBAXIN) tablet 750 mg  750 mg Oral Q6H PRN Teven Mittman T, MD   750 mg at 11/25/22 1038   mirtazapine (REMERON) tablet 15 mg  15 mg Oral QHS Annaleigh Steinmeyer T, MD   15 mg at 11/24/22 2104   naproxen (NAPROSYN) tablet 500 mg  500 mg Oral BID PRN Reggie Pile, MD   500 mg at 11/25/22 1039   nicotine (NICODERM CQ - dosed in mg/24 hours) patch 21 mg  21 mg Transdermal Daily Garnetta Fedrick, Jackquline Denmark, MD   21 mg at 11/25/22 0857   nicotine polacrilex (NICORETTE) gum 2 mg  2 mg Oral PRN Allysia Ingles, Jackquline Denmark, MD       traZODone (DESYREL) tablet 100 mg  100 mg Oral QHS PRN Dawid Dupriest, Jackquline Denmark, MD   100 mg at 11/24/22 2104    Lab Results: No results found for this or any previous visit (from the past 48 hour(s)).  Blood Alcohol level:  Lab Results  Component Value Date   ETH <10 11/16/2022   ETH <5 01/04/2016     Metabolic Disorder Labs: Lab Results  Component Value Date   HGBA1C 5.3 02/11/2022   MPG 105.41 02/11/2022   No results found for: "PROLACTIN" Lab Results  Component Value Date   CHOL 155 11/15/2008   TRIG 155.0 (H) 11/15/2008   HDL 31.00 (L) 11/15/2008   CHOLHDL 5 11/15/2008   VLDL 31.0 11/15/2008   LDLCALC 93 11/15/2008    Physical Findings: AIMS: Facial and Oral Movements Muscles of Facial Expression: None, normal Lips and Perioral Area: None, normal Jaw: None, normal Tongue: None, normal,Extremity Movements Upper (arms, wrists, hands, fingers): None, normal Lower (legs, knees, ankles, toes): None, normal, Trunk Movements Neck, shoulders, hips: None, normal, Overall Severity Severity of abnormal movements (highest score from questions above): None, normal Incapacitation due to abnormal movements: None, normal Patient's awareness of abnormal movements (rate only patient's report): No Awareness, Dental Status Current problems with teeth and/or dentures?: No Does patient usually wear dentures?: No  CIWA:    COWS:     Musculoskeletal: Strength & Muscle Tone: within normal limits Gait & Station: normal Patient leans: N/A  Psychiatric Specialty Exam:  Presentation  General Appearance: No data recorded Eye Contact:No data recorded Speech:No data recorded Speech Volume:No data recorded Handedness:No data recorded  Mood and Affect  Mood:No data recorded Affect:No data recorded  Thought Process  Thought Processes:No data recorded Descriptions of Associations:No data recorded Orientation:No data recorded Thought Content:No data recorded History of Schizophrenia/Schizoaffective disorder:No  Duration of Psychotic Symptoms:No data recorded Hallucinations:No data recorded Ideas of Reference:No data recorded Suicidal Thoughts:No data recorded Homicidal Thoughts:No data recorded  Sensorium  Memory:No data recorded Judgment:No data recorded Insight:No data  recorded  Executive Functions  Concentration:No data recorded Attention Span:No data recorded Recall:No data recorded Fund of Knowledge:No data recorded Language:No data recorded  Psychomotor Activity  Psychomotor Activity:No data recorded  Assets  Assets:No data recorded  Sleep  Sleep:No data recorded   Physical Exam: Physical Exam Vitals and nursing note reviewed.  Constitutional:      Appearance: Normal appearance.  HENT:     Head: Normocephalic and atraumatic.     Mouth/Throat:     Pharynx: Oropharynx is clear.  Eyes:     Pupils: Pupils are equal, round, and reactive to light.  Cardiovascular:     Rate and Rhythm: Normal rate and regular rhythm.  Pulmonary:     Effort: Pulmonary effort  is normal.     Breath sounds: Normal breath sounds.  Abdominal:     General: Abdomen is flat.     Palpations: Abdomen is soft.  Musculoskeletal:        General: Normal range of motion.  Skin:    General: Skin is warm and dry.  Neurological:     General: No focal deficit present.     Mental Status: He is alert. Mental status is at baseline.  Psychiatric:        Mood and Affect: Mood normal.        Thought Content: Thought content normal.    Review of Systems  Constitutional: Negative.   HENT: Negative.    Eyes: Negative.   Respiratory: Negative.    Cardiovascular: Negative.   Gastrointestinal: Negative.   Musculoskeletal: Negative.   Skin: Negative.   Neurological: Negative.   Psychiatric/Behavioral: Negative.     Blood pressure (!) 142/94, pulse 95, temperature 98 F (36.7 C), temperature source Oral, resp. rate 18, height 5\' 10"  (1.778 m), weight 71.7 kg, SpO2 98 %. Body mass index is 22.67 kg/m.   Treatment Plan Summary: Plan no change to medication.  Psychoeducation and encouragement.  Treatment team trying to work on discharge planning  Mordecai Rasmussen, MD 11/25/2022, 4:49 PM

## 2022-11-25 NOTE — Plan of Care (Signed)
  Problem: Education: Goal: Knowledge of General Education information will improve Description: Including pain rating scale, medication(s)/side effects and non-pharmacologic comfort measures Outcome: Progressing   Problem: Health Behavior/Discharge Planning: Goal: Ability to manage health-related needs will improve Outcome: Progressing   Problem: Activity: Goal: Risk for activity intolerance will decrease Outcome: Progressing   Problem: Nutrition: Goal: Adequate nutrition will be maintained Outcome: Progressing   Problem: Coping: Goal: Level of anxiety will decrease Outcome: Progressing   Problem: Coping: Goal: Coping ability will improve Outcome: Progressing

## 2022-11-25 NOTE — Group Note (Signed)
[facility] LCSW Group Therapy Note   Group Date: 11/25/2022 Start Time: 1310 End Time: 1428   Type of Therapy/Topic:  Group Therapy:  Emotion Regulation  Participation Level:  Active    Description of Group:    The purpose of this group is to assist patients in learning to regulate negative emotions and experience positive emotions. Patients will be guided to discuss ways in which they have been vulnerable to their negative emotions. These vulnerabilities will be juxtaposed with experiences of positive emotions or situations, and patients challenged to use positive emotions to combat negative ones. Special emphasis will be placed on coping with negative emotions in conflict situations, and patients will process healthy conflict resolution skills.  Therapeutic Goals: Patient will identify two positive emotions or experiences to reflect on in order to balance out negative emotions:  Patient will label two or more emotions that they find the most difficult to experience:  Patient will be able to demonstrate positive conflict resolution skills through discussion or role plays:   Summary of Patient Progress: Patient was present for the entirety of the group process. He defined emotional regulation as learning to manage our emotions. Pt spoke to the importance of experiencing our emotions instead of bottling them up because that often ends with [place] blowing up on those close to [place] or people that have little, to nothing to do with the actual situation that is bothering [place]. Although more reserved during the discussion today, he appeared to attend to the conversation and when offering his input it was pertinent and appropriate. Pt presents with slight insight into the topic. He appeared open and receptive to feedback/comments from both peers and facilitator.   Therapeutic Modalities:   Cognitive Behavioral Therapy Feelings Identification Dialectical Behavioral Therapy   Glenis Smoker, LCSW

## 2022-11-25 NOTE — Group Note (Signed)
Date:  11/25/2022 Time:  9:40 PM  Group Topic/Focus:  Identifying Needs:   The focus of this group is to help patients identify their personal needs that have been historically problematic and identify healthy behaviors to address their needs.    Participation Level:  Active  Participation Quality:  Appropriate and Attentive  Affect:  Appropriate and Excited  Cognitive:  Alert and Appropriate  Insight: Appropriate, Good, and Improving  Engagement in Group:  Developing/Improving, Engaged, and Supportive  Modes of Intervention:  Discussion, Education, Exploration, Problem-solving, Rapport Building, Role-play, and Socialization  Additional Comments:     Noah Avery 11/25/2022, 9:40 PM

## 2022-11-25 NOTE — Progress Notes (Addendum)
Pt A & O X4. Presents animated but anxious. Denies SI, HI and AVH when assessed. Rated his anxiety 6/10, depression 7/10 and hopelessness 6/10 with stressors being "Life, but I'm working with my CSW right now to get some help". Reported good appetite, slept fair last night "It was some deep sleep. I had some dreams". Received PRN Vistaril 25 mg, Naproxen 500 mg and Robaxin 750 mg as ordered all PO with desired effect when reassessed. Emotional support, encouragement and reassurance offered to pt. Safety checks maintained at Q 15 minutes intervals without incident. BP is elevated upon recheck this afternoon and evening. Pt presents asymptomatic "I'm fine, no, I'm not dizzy and I don't a headaches or blurred vision. I'm fine". Pt encouraged to rest / relax and  fluids given. Will inform oncoming shift. Pt visible in dayroom for scheduled groups, engaged in activities. Tolerated fluids, meals and medications well. Ambulatory in milieu with a steady gait. Denies concerns at this time.

## 2022-11-25 NOTE — Progress Notes (Signed)
Patient is pleasant and cooperative.Denies SI, Hi, AVH. Endorses depression. Complains of itching to back. Noted red bumps, per patient itchy and caused by Lidocaine patch. Patient declined any creams or ointments, vistaril given for itching and anxiety. Robaxin given for back spasms. Encouragement and support provided. Safety checks maintained. Medications given as prescribed. Pt receptive and remains safe on unit with q 15 min checks.

## 2022-11-25 NOTE — Group Note (Signed)
Date:  11/25/2022 Time:  5:43 PM  Group Topic/Focus:  Outdoor Recreation/Activity    Participation Level:  Active  Participation Quality:  Appropriate  Affect:  Appropriate  Cognitive:  Appropriate  Insight: Appropriate  Engagement in Group:  Engaged  Modes of Intervention:  Activity  Additional Comments:    [name] 11/25/2022, 5:43 PM

## 2022-11-25 NOTE — Group Note (Signed)
Date:  11/25/2022 Time:  10:15 AM  Group Topic/Focus:  Goals Group:   The focus of this group is to help patients establish daily goals to achieve during treatment and discuss how the patient can incorporate goal setting into their daily lives to aide in recovery.    Participation Level:  Active  Participation Quality:  Appropriate  Affect:  Appropriate  Cognitive:  Alert  Insight: Good  Engagement in Group:  Engaged  Modes of Intervention:  Activity  Additional Comments:    Noah Avery [name] 11/25/2022, 10:15 AM

## 2022-11-26 ENCOUNTER — Other Ambulatory Visit: Payer: Self-pay

## 2022-11-26 DIAGNOSIS — F332 Major depressive disorder, recurrent severe without psychotic features: Secondary | ICD-10-CM | POA: Diagnosis not present

## 2022-11-26 MED ORDER — NICOTINE 21 MG/24HR TD PT24
21.0000 mg | MEDICATED_PATCH | Freq: Every day | TRANSDERMAL | 0 refills | Status: DC
  Filled 2022-11-26: qty 14, 14d supply, fill #0

## 2022-11-26 MED ORDER — HYDROXYZINE HCL 25 MG PO TABS
25.0000 mg | ORAL_TABLET | Freq: Three times a day (TID) | ORAL | 0 refills | Status: DC | PRN
  Filled 2022-11-26: qty 20, 7d supply, fill #0

## 2022-11-26 MED ORDER — LIDOCAINE 5 % EX PTCH
1.0000 | MEDICATED_PATCH | CUTANEOUS | 0 refills | Status: DC
  Filled 2022-11-26: qty 10, 10d supply, fill #0

## 2022-11-26 MED ORDER — TRAZODONE HCL 100 MG PO TABS
100.0000 mg | ORAL_TABLET | Freq: Every evening | ORAL | 0 refills | Status: DC | PRN
  Filled 2022-11-26: qty 10, 10d supply, fill #0

## 2022-11-26 MED ORDER — NAPROXEN 500 MG PO TABS
500.0000 mg | ORAL_TABLET | Freq: Two times a day (BID) | ORAL | 0 refills | Status: DC | PRN
  Filled 2022-11-26: qty 20, 10d supply, fill #0

## 2022-11-26 MED ORDER — METHOCARBAMOL 750 MG PO TABS
750.0000 mg | ORAL_TABLET | Freq: Four times a day (QID) | ORAL | 0 refills | Status: DC | PRN
  Filled 2022-11-26: qty 20, 5d supply, fill #0

## 2022-11-26 MED ORDER — FLUOXETINE HCL 20 MG PO CAPS
20.0000 mg | ORAL_CAPSULE | Freq: Every day | ORAL | 0 refills | Status: DC
  Filled 2022-11-26: qty 10, 10d supply, fill #0

## 2022-11-26 MED ORDER — MIRTAZAPINE 15 MG PO TABS
15.0000 mg | ORAL_TABLET | Freq: Every day | ORAL | 0 refills | Status: DC
  Filled 2022-11-26: qty 10, 10d supply, fill #0

## 2022-11-26 MED ORDER — NICOTINE POLACRILEX 2 MG MT GUM
2.0000 mg | CHEWING_GUM | OROMUCOSAL | 0 refills | Status: DC | PRN
  Filled 2022-11-26: qty 50, 10d supply, fill #0

## 2022-11-26 NOTE — [facility] Group Notes (Signed)
  Date:  11/26/2022 Time:  10:33 AM   Group Topic/Focus: Crisis Management  Identifying Needs:   Identifying and addressing what crisis looks and feels like.        Participation Level:  Active   Participation Quality:  Appropriate and Attentive   Affect:  Appropriate and Excited   Cognitive:  Alert and Appropriate   Insight: Appropriate, Good, and Improving   Engagement in Group:  Developing/Improving and Engaged   Modes of Intervention:  Discussion, Education, Exploration, Rapport Building, Reality Testing, Socialization, and Support   Additional Comments:         

## 2022-11-26 NOTE — Plan of Care (Signed)
Patient rated his depression and anxiety 7/10. Patient pleasant and cooperative on approach. Denies SI,HI and AVH. Patient had anxiety medicine x 1. Appropriate with staff & peers. Visible in the milieu. Appetite and energy level good. ADLs maintained. Support and encouragement given.

## 2022-11-26 NOTE — Group Note (Signed)
Date:  11/26/2022 Time:  10:47 PM  Group Topic/Focus:  Wrap-Up Group:   The focus of this group is to help patients review their daily goal of treatment and discuss progress on daily workbooks.    Participation Level:  Active  Participation Quality:  Appropriate and Attentive  Affect:  Appropriate and Excited  Cognitive:  Alert and Appropriate  Insight: Appropriate and Good  Engagement in Group:  Developing/Improving and Engaged  Modes of Intervention:  Discussion, Problem-solving, Socialization, and Support  Additional Comments:     Rosann Gorum 11/26/2022, 10:47 PM

## 2022-11-26 NOTE — Progress Notes (Signed)
[facility] MD Progress Note  11/26/2022 11:46 AM Noah Avery  MRN:  [id number] Subjective: Patient seen for follow-up.  52 year old man with homelessness and depression.  He tells me that he is feeling "a little bit more down" today but denies suicidal thoughts.  Staff note that he has been eagerly attending and active in all groups.  Good self-care and good hygiene no behavior problems. Principal Problem: Major depressive disorder, recurrent severe without psychotic features (HCC) Diagnosis: Principal Problem:   Major depressive disorder, recurrent severe without psychotic features (HCC) Active Problems:   Suicidal ideation  Total Time spent with patient: 30 minutes  Past Psychiatric History: Past history of depression and homelessness  Past Medical History:  Past Medical History:  Diagnosis Date   Depression    H/O degenerative disc disease     Past Surgical History:  Procedure Laterality Date   BACK SURGERY     Family History: History reviewed. No pertinent family history. Family Psychiatric  History: See previous Social History:  Social History   Substance and Sexual Activity  Alcohol Use Yes   Comment: occasional     Social History   Substance and Sexual Activity  Drug Use No    Social History   Socioeconomic History   Marital status: Divorced    Spouse name: Not on file   Number of children: Not on file   Years of education: Not on file   Highest education level: Not on file  Occupational History   Not on file  Tobacco Use   Smoking status: Every Day    Packs/day: 1.00    Years: 15.00    Additional pack years: 0.00    Total pack years: 15.00    Types: Cigarettes    Passive exposure: Current   Smokeless tobacco: Never  Vaping Use   Vaping Use: Never used  Substance and Sexual Activity   Alcohol use: Yes    Comment: occasional   Drug use: No   Sexual activity: Not on file  Other Topics Concern   Not on file  Social History Narrative   Not on file    Social Determinants of Health   Financial Resource Strain: Not on file  Food Insecurity: Food Insecurity Present (11/17/2022)   Hunger Vital Sign    Worried About Running Out of Food in the Last Year: Often true    Ran Out of Food in the Last Year: Often true  Transportation Needs: Unmet Transportation Needs (11/17/2022)   PRAPARE - Administrator, Civil Service (Medical): Yes    Lack of Transportation (Non-Medical): Yes  Physical Activity: Not on file  Stress: Not on file  Social Connections: Not on file   Additional Social History:                         Sleep: Fair  Appetite:  Fair  Current Medications: Current Facility-Administered Medications  Medication Dose Route Frequency Provider Last Rate Last Admin   acetaminophen (TYLENOL) tablet 650 mg  650 mg Oral Q6H PRN Penn, Cranston Neighbor, NP   650 mg at 11/21/22 2128   alum & mag hydroxide-simeth (MAALOX/MYLANTA) 200-200-20 MG/5ML suspension 30 mL  30 mL Oral Q4H PRN Penn, Cicely, NP       FLUoxetine (PROZAC) capsule 20 mg  20 mg Oral Daily Zenia Guest T, MD   20 mg at 11/26/22 0853   hydrOXYzine (ATARAX) tablet 25 mg  25 mg Oral TID PRN Mcneil Sober,  NP   25 mg at 11/25/22 2106   lidocaine (LIDODERM) 5 % 1 patch  1 patch Transdermal Q24H Reggie Pile, MD   1 patch at 11/24/22 1610   magnesium hydroxide (MILK OF MAGNESIA) suspension 30 mL  30 mL Oral Daily PRN Penn, Cranston Neighbor, NP       methocarbamol (ROBAXIN) tablet 750 mg  750 mg Oral Q6H PRN Kaslyn Richburg T, MD   750 mg at 11/26/22 0855   mirtazapine (REMERON) tablet 15 mg  15 mg Oral QHS Jheri Mitter T, MD   15 mg at 11/25/22 2106   naproxen (NAPROSYN) tablet 500 mg  500 mg Oral BID PRN Reggie Pile, MD   500 mg at 11/25/22 1039   nicotine (NICODERM CQ - dosed in mg/24 hours) patch 21 mg  21 mg Transdermal Daily Jetaun Colbath T, MD   21 mg at 11/26/22 9604   nicotine polacrilex (NICORETTE) gum 2 mg  2 mg Oral PRN Redell Bhandari, Jackquline Denmark, MD       traZODone (DESYREL)  tablet 100 mg  100 mg Oral QHS PRN Havyn Ramo, Jackquline Denmark, MD   100 mg at 11/25/22 2106    Lab Results: No results found for this or any previous visit (from the past 48 hour(s)).  Blood Alcohol level:  Lab Results  Component Value Date   ETH <10 11/16/2022   ETH <5 01/04/2016    Metabolic Disorder Labs: Lab Results  Component Value Date   HGBA1C 5.3 02/11/2022   MPG 105.41 02/11/2022   No results found for: "PROLACTIN" Lab Results  Component Value Date   CHOL 155 11/15/2008   TRIG 155.0 (H) 11/15/2008   HDL 31.00 (L) 11/15/2008   CHOLHDL 5 11/15/2008   VLDL 31.0 11/15/2008   LDLCALC 93 11/15/2008    Physical Findings: AIMS: Facial and Oral Movements Muscles of Facial Expression: None, normal Lips and Perioral Area: None, normal Jaw: None, normal Tongue: None, normal,Extremity Movements Upper (arms, wrists, hands, fingers): None, normal Lower (legs, knees, ankles, toes): None, normal, Trunk Movements Neck, shoulders, hips: None, normal, Overall Severity Severity of abnormal movements (highest score from questions above): None, normal Incapacitation due to abnormal movements: None, normal Patient's awareness of abnormal movements (rate only patient's report): No Awareness, Dental Status Current problems with teeth and/or dentures?: No Does patient usually wear dentures?: No  CIWA:    COWS:     Musculoskeletal: Strength & Muscle Tone: within normal limits Gait & Station: normal Patient leans: N/A  Psychiatric Specialty Exam:  Presentation  General Appearance: No data recorded Eye Contact:No data recorded Speech:No data recorded Speech Volume:No data recorded Handedness:No data recorded  Mood and Affect  Mood:No data recorded Affect:No data recorded  Thought Process  Thought Processes:No data recorded Descriptions of Associations:No data recorded Orientation:No data recorded Thought Content:No data recorded History of Schizophrenia/Schizoaffective  disorder:No  Duration of Psychotic Symptoms:No data recorded Hallucinations:No data recorded Ideas of Reference:No data recorded Suicidal Thoughts:No data recorded Homicidal Thoughts:No data recorded  Sensorium  Memory:No data recorded Judgment:No data recorded Insight:No data recorded  Executive Functions  Concentration:No data recorded Attention Span:No data recorded Recall:No data recorded Fund of Knowledge:No data recorded Language:No data recorded  Psychomotor Activity  Psychomotor Activity:No data recorded  Assets  Assets:No data recorded  Sleep  Sleep:No data recorded   Physical Exam: Physical Exam Vitals reviewed.  Constitutional:      Appearance: Normal appearance.  HENT:     Head: Normocephalic and atraumatic.     Mouth/Throat:  Pharynx: Oropharynx is clear.  Eyes:     Pupils: Pupils are equal, round, and reactive to light.  Cardiovascular:     Rate and Rhythm: Normal rate and regular rhythm.  Pulmonary:     Effort: Pulmonary effort is normal.     Breath sounds: Normal breath sounds.  Abdominal:     General: Abdomen is flat.     Palpations: Abdomen is soft.  Musculoskeletal:        General: Normal range of motion.  Skin:    General: Skin is warm and dry.  Neurological:     General: No focal deficit present.     Mental Status: He is alert. Mental status is at baseline.  Psychiatric:        Mood and Affect: Mood normal.        Thought Content: Thought content normal.    Review of Systems  Constitutional: Negative.   HENT: Negative.    Eyes: Negative.   Respiratory: Negative.    Cardiovascular: Negative.   Gastrointestinal: Negative.   Musculoskeletal: Negative.   Skin: Negative.   Neurological: Negative.   Psychiatric/Behavioral: Negative.     Blood pressure 137/89, pulse 93, temperature 97.7 F (36.5 C), temperature source Oral, resp. rate 12, height 5\' 10"  (1.778 m), weight 71.7 kg, SpO2 98 %. Body mass index is 22.67  kg/m.   Treatment Plan Summary: Plan suggested to patient that he be more aggressive in trying to call places to live which he is going to do this afternoon.  Proposed to him that possibly he could be discharged tomorrow.  He does not feel comfortable with that.  We will see whether we might be able to work towards the weekend.  Mordecai Rasmussen, MD 11/26/2022, 11:46 AM

## 2022-11-26 NOTE — Group Note (Signed)
Recreation Therapy Group Note   Group Topic:Coping Skills  Group Date: 11/26/2022 Start Time: 1015 End Time: 1120 Facilitators: Rosina Lowenstein, LRT, CTRS Location:  Craft Room  Group Description: Mind Map.  Patient was provided a blank template of a diagram with 32 blank boxes in a tiered system, branching from the center (similar to a bubble chart). LRT directed patients to label the middle of the diagram "Coping Skills". LRT and patients then came up with 8 different coping skills as examples. Pt were directed to record their coping skills in the 2nd tier boxes closest to the center.  Patients would then share their coping skills with the group as LRT wrote them out. LRT gave a handout of 100 different coping skills at the end of group.   Goal Area(s) Addressed: Patients will be able to define "coping skills". Patient will identify new coping skills.  Patient will identify new possible leisure interests.   Affect/Mood: Appropriate   Participation Level: Active and Engaged   Participation Quality: Independent   Behavior: Calm, Cooperative, and Eager   Speech/Thought Process: Coherent   Insight: Good   Judgement: Good   Modes of Intervention: Guided Discussion and Worksheet   Patient Response to Interventions:  Attentive, Engaged, Interested , and Receptive   Education Outcome:  Acknowledges education   Clinical Observations/Individualized Feedback: Gunnar was active in their participation of session activities and group discussion. Pt identified "nature, garden, walk, relax, deep breathing, pets, and journal" as some of his coping skills he put on the paper. Pt spontaneously contributed to group discussion on more than one occasion. Pt interacted well with LRT and peers duration of session.    Plan: Continue to engage patient in RT group sessions 2-3x/week.   Rosina Lowenstein, LRT, CTRS 11/26/2022 11:49 AM

## 2022-11-26 NOTE — Progress Notes (Signed)
Patient is pleasant and cooperative. Complaints of spasms and anxiety. Prn given with good relief. Denies SI, Hi, AVH. Noted in dayroom watching tv. Did attend group. No distress noted. Encouragement and support provided. Safety checks maintained. Medications given as prescribed. Pt receptive and remains safe on unit with q 15 min checks.

## 2022-11-26 NOTE — Plan of Care (Signed)
  Problem: Nutrition: Goal: Adequate nutrition will be maintained Outcome: Progressing   Problem: Coping: Goal: Level of anxiety will decrease Outcome: Progressing   Problem: Pain Managment: Goal: General experience of comfort will improve Outcome: Progressing   Problem: Safety: Goal: Ability to remain free from injury will improve Outcome: Progressing   

## 2022-11-26 NOTE — Group Note (Signed)
[facility] LCSW Group Therapy Note   Group Date: 11/26/2022 Start Time: 1315 End Time: 1420   Type of Therapy/Topic:  Group Therapy:  Balance in Life  Participation Level:  Active   Description of Group:    This group will address the concept of balance and how it feels and looks when one is unbalanced. Patients will be encouraged to process areas in their lives that are out of balance, and identify reasons for remaining unbalanced. Facilitators will guide patients utilizing problem- solving interventions to address and correct the stressor making their life unbalanced. Understanding and applying boundaries will be explored and addressed for obtaining  and maintaining a balanced life. Patients will be encouraged to explore ways to assertively make their unbalanced needs known to significant others in their lives, using other group members and facilitator for support and feedback.  Therapeutic Goals: Patient will identify two or more emotions or situations they have that consume much of in their lives. Patient will identify signs/triggers that life has become out of balance:  Patient will identify two ways to set boundaries in order to achieve balance in their lives:  Patient will demonstrate ability to communicate their needs through discussion and/or role plays  Summary of Patient Progress: Patient was present for the entirety of the group process. He shared that anxiety and depression can throw him off balance. Pt stated that during the six months prior to his mother's death he took care of her and worked through he anticipatory anxiety around grief, however, after her death pt dove into work to his detriment. He stated that it took him a long time to get to a good place. Pt went on to talk about regaining balance once at the time of divorce as he lost his job due to an injury but he utilized his resources and found himself another job. 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 Slight insight into the topic present. Pt appeared open and receptive to feedback/comments from both peers and facilitator. During conversation, pt responses and behavior were appropriate.    Therapeutic Modalities:   Cognitive Behavioral Therapy Solution-Focused Therapy Assertiveness Training   Glenis Smoker, LCSW

## 2022-11-26 NOTE — Group Note (Signed)
Date:  11/26/2022 Time:  6:22 PM  Group Topic/Focus:  Outdoor Recreation/Activity    Participation Level:  Active  Participation Quality:  Appropriate  Affect:  Appropriate  Cognitive:  Appropriate  Insight: Appropriate  Engagement in Group:  Engaged  Modes of Intervention:  Activity  Additional Comments:    Marvie Brevik Travis Cheryn Lundquist 11/26/2022, 6:22 PM  

## 2022-11-27 NOTE — Group Note (Signed)
Recreation Therapy Group Note   Group Topic:Leisure Education  Group Date: 11/27/2022 Start Time: 1000 End Time: 1100 Facilitators: Rosina Lowenstein, LRT, CTRS Location:  Craft Room  Group Description: Leisure. Patients were given the option to choose from singing karaoke, making origami, using oil pastels, coloring mandalas, or playing UNO. LRT and pts discussed the meaning of leisure, the importance of participating in leisure during their free time/when they're outside of the hospital, as well as how our leisure interests can also serve as coping skills. Pt identified two leisure interests and shared with the group.   Goal Area(s) Addressed:  Patient will identify a current leisure interest.  Patient will learn the definition of "leisure". Patient will practice making a positive decision. Patient will have the opportunity to try a new leisure activity. Patient will communicate with peers and LRT.   Affect/Mood: Appropriate   Participation Level: Active and Engaged   Participation Quality: Independent   Behavior: Calm and Cooperative   Speech/Thought Process: Coherent   Insight: Good   Judgement: Good   Modes of Intervention: Activity   Patient Response to Interventions:  Attentive, Engaged, Interested , and Receptive   Education Outcome:  Acknowledges education   Clinical Observations/Individualized Feedback: Noah Avery was active in their participation of session activities and group discussion. Pt identified "go for a walk or play video games" as things he does in his free time. Pt chose to make origami while in group. Pt interacted well with LRT and peers duration of session.   Plan: Continue to engage patient in RT group sessions 2-3x/week.   Rosina Lowenstein, LRT, CTRS 11/27/2022 11:48 AM

## 2022-11-27 NOTE — Plan of Care (Signed)
Patient appropriate with staff & peers. Patient had PRNs for anxiety and spasms with relief. Patient denies SI,HI and AVH. Patient active in the milieu.Appetite and energy level good. Support and encouragement given.

## 2022-11-27 NOTE — Progress Notes (Signed)
D -  Noah Avery is endorsing moderate levels of anxiety and depression. He remained visible in the milieu and behaviorally appropriate.  Patient is able to advocate for needs.  Mood appeared stable.  A - Medications administered as ordered with PRN's provided for heightened anxiety, chronic back pain, and sleep.  R.  Patient fell asleep before 2230, voicing no concerns or complaints.  Pain managed effectively with Robaxin.  Will continue to provide care and monitor.

## 2022-11-27 NOTE — Progress Notes (Signed)
[facility] MD Progress Note  11/27/2022 4:27 PM Noah Avery  MRN:  [id number] Subjective: No new complaints no change to presentation Principal Problem: Major depressive disorder, recurrent severe without psychotic features (HCC) Diagnosis: Principal Problem:   Major depressive disorder, recurrent severe without psychotic features (HCC) Active Problems:   Suicidal ideation  Total Time spent with patient: 15 minutes  Past Psychiatric History: Depression  Past Medical History:  Past Medical History:  Diagnosis Date   Depression    H/O degenerative disc disease     Past Surgical History:  Procedure Laterality Date   BACK SURGERY     Family History: History reviewed. No pertinent family history. Family Psychiatric  History: See previous Social History:  Social History   Substance and Sexual Activity  Alcohol Use Yes   Comment: occasional     Social History   Substance and Sexual Activity  Drug Use No    Social History   Socioeconomic History   Marital status: Divorced    Spouse name: Not on file   Number of children: Not on file   Years of education: Not on file   Highest education level: Not on file  Occupational History   Not on file  Tobacco Use   Smoking status: Every Day    Packs/day: 1.00    Years: 15.00    Additional pack years: 0.00    Total pack years: 15.00    Types: Cigarettes    Passive exposure: Current   Smokeless tobacco: Never  Vaping Use   Vaping Use: Never used  Substance and Sexual Activity   Alcohol use: Yes    Comment: occasional   Drug use: No   Sexual activity: Not on file  Other Topics Concern   Not on file  Social History Narrative   Not on file   Social Determinants of Health   Financial Resource Strain: Not on file  Food Insecurity: Food Insecurity Present (11/17/2022)   Hunger Vital Sign    Worried About Running Out of Food in the Last Year: Often true    Ran Out of Food in the Last Year: Often true  Transportation Needs:  Unmet Transportation Needs (11/17/2022)   PRAPARE - Administrator, Civil Service (Medical): Yes    Lack of Transportation (Non-Medical): Yes  Physical Activity: Not on file  Stress: Not on file  Social Connections: Not on file   Additional Social History:                         Sleep: Fair  Appetite:  Fair  Current Medications: Current Facility-Administered Medications  Medication Dose Route Frequency Provider Last Rate Last Admin   acetaminophen (TYLENOL) tablet 650 mg  650 mg Oral Q6H PRN Penn, Cranston Neighbor, NP   650 mg at 11/21/22 2128   alum & mag hydroxide-simeth (MAALOX/MYLANTA) 200-200-20 MG/5ML suspension 30 mL  30 mL Oral Q4H PRN Penn, Cicely, NP       FLUoxetine (PROZAC) capsule 20 mg  20 mg Oral Daily Danisha Brassfield T, MD   20 mg at 11/27/22 0830   hydrOXYzine (ATARAX) tablet 25 mg  25 mg Oral TID PRN Mcneil Sober, NP   25 mg at 11/27/22 0830   lidocaine (LIDODERM) 5 % 1 patch  1 patch Transdermal Q24H Reggie Pile, MD   1 patch at 11/24/22 0853   magnesium hydroxide (MILK OF MAGNESIA) suspension 30 mL  30 mL Oral Daily PRN Mcneil Sober, NP  methocarbamol (ROBAXIN) tablet 750 mg  750 mg Oral Q6H PRN Tenecia Ignasiak T, MD   750 mg at 11/27/22 1212   mirtazapine (REMERON) tablet 15 mg  15 mg Oral QHS Tashia Leiterman T, MD   15 mg at 11/25/22 2106   naproxen (NAPROSYN) tablet 500 mg  500 mg Oral BID PRN Reggie Pile, MD   500 mg at 11/25/22 1039   nicotine (NICODERM CQ - dosed in mg/24 hours) patch 21 mg  21 mg Transdermal Daily Juliahna Wiswell T, MD   21 mg at 11/27/22 0830   nicotine polacrilex (NICORETTE) gum 2 mg  2 mg Oral PRN Jovon Streetman, Jackquline Denmark, MD       traZODone (DESYREL) tablet 100 mg  100 mg Oral QHS PRN Raneisha Bress, Jackquline Denmark, MD   100 mg at 11/25/22 2106    Lab Results: No results found for this or any previous visit (from the past 48 hour(s)).  Blood Alcohol level:  Lab Results  Component Value Date   ETH <10 11/16/2022   ETH <5 01/04/2016     Metabolic Disorder Labs: Lab Results  Component Value Date   HGBA1C 5.3 02/11/2022   MPG 105.41 02/11/2022   No results found for: "PROLACTIN" Lab Results  Component Value Date   CHOL 155 11/15/2008   TRIG 155.0 (H) 11/15/2008   HDL 31.00 (L) 11/15/2008   CHOLHDL 5 11/15/2008   VLDL 31.0 11/15/2008   LDLCALC 93 11/15/2008    Physical Findings: AIMS: Facial and Oral Movements Muscles of Facial Expression: None, normal Lips and Perioral Area: None, normal Jaw: None, normal Tongue: None, normal,Extremity Movements Upper (arms, wrists, hands, fingers): None, normal Lower (legs, knees, ankles, toes): None, normal, Trunk Movements Neck, shoulders, hips: None, normal, Overall Severity Severity of abnormal movements (highest score from questions above): None, normal Incapacitation due to abnormal movements: None, normal Patient's awareness of abnormal movements (rate only patient's report): No Awareness, Dental Status Current problems with teeth and/or dentures?: No Does patient usually wear dentures?: No  CIWA:    COWS:     Musculoskeletal: Strength & Muscle Tone: within normal limits Gait & Station: normal Patient leans: N/A  Psychiatric Specialty Exam:  Presentation  General Appearance: No data recorded Eye Contact:No data recorded Speech:No data recorded Speech Volume:No data recorded Handedness:No data recorded  Mood and Affect  Mood:No data recorded Affect:No data recorded  Thought Process  Thought Processes:No data recorded Descriptions of Associations:No data recorded Orientation:No data recorded Thought Content:No data recorded History of Schizophrenia/Schizoaffective disorder:No  Duration of Psychotic Symptoms:No data recorded Hallucinations:No data recorded Ideas of Reference:No data recorded Suicidal Thoughts:No data recorded Homicidal Thoughts:No data recorded  Sensorium  Memory:No data recorded Judgment:No data recorded Insight:No data  recorded  Executive Functions  Concentration:No data recorded Attention Span:No data recorded Recall:No data recorded Fund of Knowledge:No data recorded Language:No data recorded  Psychomotor Activity  Psychomotor Activity:No data recorded  Assets  Assets:No data recorded  Sleep  Sleep:No data recorded   Physical Exam: Physical Exam Vitals reviewed.  Constitutional:      Appearance: Normal appearance.  HENT:     Head: Normocephalic and atraumatic.     Mouth/Throat:     Pharynx: Oropharynx is clear.  Eyes:     Pupils: Pupils are equal, round, and reactive to light.  Cardiovascular:     Rate and Rhythm: Normal rate and regular rhythm.  Pulmonary:     Effort: Pulmonary effort is normal.     Breath sounds: Normal breath sounds.  Abdominal:     General: Abdomen is flat.     Palpations: Abdomen is soft.  Musculoskeletal:        General: Normal range of motion.  Skin:    General: Skin is warm and dry.  Neurological:     General: No focal deficit present.     Mental Status: He is alert. Mental status is at baseline.  Psychiatric:        Mood and Affect: Mood normal.        Thought Content: Thought content normal.    Review of Systems  Constitutional: Negative.   HENT: Negative.    Eyes: Negative.   Respiratory: Negative.    Cardiovascular: Negative.   Gastrointestinal: Negative.   Musculoskeletal: Negative.   Skin: Negative.   Neurological: Negative.   Psychiatric/Behavioral: Negative.     Blood pressure (!) 153/89, pulse (!) 110, temperature 97.9 F (36.6 C), temperature source Oral, resp. rate 19, height 5\' 10"  (1.778 m), weight 71.7 kg, SpO2 98 %. Body mass index is 22.67 kg/m.   Treatment Plan Summary: Homeless no place to stay.  Does not agree to going to [place].  Likely discharge somewhere next week if we can.  Noah Rasmussen, MD 11/27/2022, 4:27 PM

## 2022-11-27 NOTE — Group Note (Signed)
Date:  11/27/2022 Time:  6:10 PM  Group Topic/Focus:   Recreational Activity Group    Participation Level:  Active  Participation Quality:  Appropriate and Attentive  Affect:  Appropriate  Cognitive:  Appropriate  Insight: Appropriate  Engagement in Group:  Engaged  Modes of Intervention:  Activity  Additional Comments:    Bernerd Terhune A Estephan Gallardo 11/27/2022, 6:10 PM  

## 2022-11-28 DIAGNOSIS — F332 Major depressive disorder, recurrent severe without psychotic features: Secondary | ICD-10-CM | POA: Diagnosis not present

## 2022-11-28 MED ORDER — RISPERIDONE 1 MG PO TABS
0.5000 mg | ORAL_TABLET | ORAL | Status: DC
  Administered 2022-11-29 – 2022-11-30 (×3): 0.5 mg via ORAL
  Filled 2022-11-28 (×3): qty 1

## 2022-11-28 NOTE — Progress Notes (Signed)
11/28/2022 4:54 PM Noah Avery  MRN:  [id number] Subjective: Noah Avery is seen on rounds.  He is having a lot of anxiety.  He states that he slept well.  Denies any suicidal ideation.  He ruminates a lot over his current situation. Principal Problem: Major depressive disorder, recurrent severe without psychotic features (HCC) Diagnosis: Principal Problem:   Major depressive disorder, recurrent severe without psychotic features (HCC) Active Problems:   Suicidal ideation  Total Time spent with patient: 15 minutes  Past Psychiatric History: Depression  Past Medical History:  Past Medical History:  Diagnosis Date   Depression    H/O degenerative disc disease     Past Surgical History:  Procedure Laterality Date   BACK SURGERY     Family History: History reviewed. No pertinent family history. Family Psychiatric  History: Unremarkable Social History:  Social History   Substance and Sexual Activity  Alcohol Use Yes   Comment: occasional     Social History   Substance and Sexual Activity  Drug Use No    Social History   Socioeconomic History   Marital status: Divorced    Spouse name: Not on file   Number of children: Not on file   Years of education: Not on file   Highest education level: Not on file  Occupational History   Not on file  Tobacco Use   Smoking status: Every Day    Packs/day: 1.00    Years: 15.00    Additional pack years: 0.00    Total pack years: 15.00    Types: Cigarettes    Passive exposure: Current   Smokeless tobacco: Never  Vaping Use   Vaping Use: Never used  Substance and Sexual Activity   Alcohol use: Yes    Comment: occasional   Drug use: No   Sexual activity: Not on file  Other Topics Concern   Not on file  Social History Narrative   Not on file   Social Determinants of Health   Financial Resource Strain: Not on file  Food Insecurity: Food Insecurity Present (11/17/2022)   Hunger Vital Sign    Worried About Running Out of  Food in the Last Year: Often true    Ran Out of Food in the Last Year: Often true  Transportation Needs: Unmet Transportation Needs (11/17/2022)   PRAPARE - Administrator, Civil Service (Medical): Yes    Lack of Transportation (Non-Medical): Yes  Physical Activity: Not on file  Stress: Not on file  Social Connections: Not on file   Additional Social History:                         Sleep: Good  Appetite:  Good  Current Medications: Current Facility-Administered Medications  Medication Dose Route Frequency Provider Last Rate Last Admin   acetaminophen (TYLENOL) tablet 650 mg  650 mg Oral Q6H PRN Penn, Cranston Neighbor, NP   650 mg at 11/21/22 2128   alum & mag hydroxide-simeth (MAALOX/MYLANTA) 200-200-20 MG/5ML suspension 30 mL  30 mL Oral Q4H PRN Penn, Cicely, NP       FLUoxetine (PROZAC) capsule 20 mg  20 mg Oral Daily Clapacs, John T, MD   20 mg at 11/28/22 0835   hydrOXYzine (ATARAX) tablet 25 mg  25 mg Oral TID PRN Mcneil Sober, NP   25 mg at 11/28/22 1357   lidocaine (LIDODERM) 5 % 1 patch  1 patch Transdermal Q24H Reggie Pile, MD   1 patch  at 11/24/22 0853   magnesium hydroxide (MILK OF MAGNESIA) suspension 30 mL  30 mL Oral Daily PRN Penn, Cranston Neighbor, NP       methocarbamol (ROBAXIN) tablet 750 mg  750 mg Oral Q6H PRN Clapacs, Jackquline Denmark, MD   750 mg at 11/28/22 0934   mirtazapine (REMERON) tablet 15 mg  15 mg Oral QHS Clapacs, John T, MD   15 mg at 11/27/22 2111   naproxen (NAPROSYN) tablet 500 mg  500 mg Oral BID PRN Reggie Pile, MD   500 mg at 11/25/22 1039   nicotine (NICODERM CQ - dosed in mg/24 hours) patch 21 mg  21 mg Transdermal Daily Clapacs, Jackquline Denmark, MD   21 mg at 11/28/22 4098   nicotine polacrilex (NICORETTE) gum 2 mg  2 mg Oral PRN Clapacs, John T, MD       risperiDONE (RISPERDAL) tablet 0.5 mg  0.5 mg Oral [id number] Pope Brunty Edward, DO       traZODone (DESYREL) tablet 100 mg  100 mg Oral QHS PRN Clapacs, Jackquline Denmark, MD   100 mg at 11/25/22 2106    Lab  Results: No results found for this or any previous visit (from the past 48 hour(s)).  Blood Alcohol level:  Lab Results  Component Value Date   ETH <10 11/16/2022   ETH <5 01/04/2016    Metabolic Disorder Labs: Lab Results  Component Value Date   HGBA1C 5.3 02/11/2022   MPG 105.41 02/11/2022   No results found for: "PROLACTIN" Lab Results  Component Value Date   CHOL 155 11/15/2008   TRIG 155.0 (H) 11/15/2008   HDL 31.00 (L) 11/15/2008   CHOLHDL 5 11/15/2008   VLDL 31.0 11/15/2008   LDLCALC 93 11/15/2008    Physical Findings: AIMS: Facial and Oral Movements Muscles of Facial Expression: None, normal Lips and Perioral Area: None, normal Jaw: None, normal Tongue: None, normal,Extremity Movements Upper (arms, wrists, hands, fingers): None, normal Lower (legs, knees, ankles, toes): None, normal, Trunk Movements Neck, shoulders, hips: None, normal, Overall Severity Severity of abnormal movements (highest score from questions above): None, normal Incapacitation due to abnormal movements: None, normal Patient's awareness of abnormal movements (rate only patient's report): No Awareness, Dental Status Current problems with teeth and/or dentures?: No Does patient usually wear dentures?: No  CIWA:    COWS:     Musculoskeletal: Strength & Muscle Tone: within normal limits Gait & Station: normal Patient leans: N/A  Psychiatric Specialty Exam:  Presentation  General Appearance: No data recorded Eye Contact:No data recorded Speech:No data recorded Speech Volume:No data recorded Handedness:No data recorded  Mood and Affect  Mood:No data recorded Affect:No data recorded  Thought Process  Thought Processes:No data recorded Descriptions of Associations:No data recorded Orientation:No data recorded Thought Content:No data recorded History of Schizophrenia/Schizoaffective disorder:No  Duration of Psychotic Symptoms:No data recorded Hallucinations:No data  recorded Ideas of Reference:No data recorded Suicidal Thoughts:No data recorded Homicidal Thoughts:No data recorded  Sensorium  Memory:No data recorded Judgment:No data recorded Insight:No data recorded  Executive Functions  Concentration:No data recorded Attention Span:No data recorded Recall:No data recorded Fund of Knowledge:No data recorded Language:No data recorded  Psychomotor Activity  Psychomotor Activity:No data recorded  Assets  Assets:No data recorded  Sleep  Sleep:No data recorded   Blood pressure (!) 122/103, pulse (!) 104, temperature 97.7 F (36.5 C), temperature source Oral, resp. rate 18, height 5\' 10"  (1.778 m), weight 71.7 kg, SpO2 97 %. Body mass index is 22.67 kg/m.   Treatment Plan Summary:  Daily contact with patient to assess and evaluate symptoms and progress in treatment, Medication management, and Plan continue current medications and start risperidone 0.5 mg twice a day for anxiety.  Sarina Ill, DO 11/28/2022, 4:54 PM

## 2022-11-28 NOTE — Plan of Care (Signed)
Patient appropriate with staff & peers. Patient states that the anxiety always comes and goes. Vistaril helps with anxiety. Patient visible in the milieu. Denies SI,HI and AVH. Appetite and energy level good. ADLs maintained. Support and encouragement given.

## 2022-11-28 NOTE — Group Note (Signed)
LCSW Group Therapy Note   Group Date: 11/28/2022 Start Time: 1305 End Time: 1345   Type of Therapy and Topic:  Group Therapy: Wellness  Participation Level:  Active    Summary of Patient Progress:  The patient attended group. The patient stated that getting his emotions out is something he struggles with. The patient stated that he has not cried in years. The patient had a good understanding of the different dimensions of wellness.     Marshell Levan, LCSWA 11/28/2022  3:26 PM

## 2022-11-28 NOTE — BH IP Treatment Plan (Signed)
Interdisciplinary Treatment and Diagnostic Plan Update  11/28/2022 Time of Session: 9:14 am Noah Avery MRN: [id number]  Principal Diagnosis: Major depressive disorder, recurrent severe without psychotic features (HCC)  Secondary Diagnoses: Principal Problem:   Major depressive disorder, recurrent severe without psychotic features (HCC) Active Problems:   Suicidal ideation   Current Medications:  Current Facility-Administered Medications  Medication Dose Route Frequency Provider Last Rate Last Admin   acetaminophen (TYLENOL) tablet 650 mg  650 mg Oral Q6H PRN Penn, Cranston Neighbor, NP   650 mg at 11/21/22 2128   alum & mag hydroxide-simeth (MAALOX/MYLANTA) 200-200-20 MG/5ML suspension 30 mL  30 mL Oral Q4H PRN Penn, Cranston Neighbor, NP       FLUoxetine (PROZAC) capsule 20 mg  20 mg Oral Daily Clapacs, John T, MD   20 mg at 11/28/22 0835   hydrOXYzine (ATARAX) tablet 25 mg  25 mg Oral TID PRN Mcneil Sober, NP   25 mg at 11/28/22 0835   lidocaine (LIDODERM) 5 % 1 patch  1 patch Transdermal Q24H Reggie Pile, MD   1 patch at 11/24/22 9562   magnesium hydroxide (MILK OF MAGNESIA) suspension 30 mL  30 mL Oral Daily PRN Penn, Cranston Neighbor, NP       methocarbamol (ROBAXIN) tablet 750 mg  750 mg Oral Q6H PRN Clapacs, John T, MD   750 mg at 11/27/22 2009   mirtazapine (REMERON) tablet 15 mg  15 mg Oral QHS Clapacs, John T, MD   15 mg at 11/27/22 2111   naproxen (NAPROSYN) tablet 500 mg  500 mg Oral BID PRN Reggie Pile, MD   500 mg at 11/25/22 1039   nicotine (NICODERM CQ - dosed in mg/24 hours) patch 21 mg  21 mg Transdermal Daily Clapacs, Jackquline Denmark, MD   21 mg at 11/28/22 1308   nicotine polacrilex (NICORETTE) gum 2 mg  2 mg Oral PRN Clapacs, Jackquline Denmark, MD       traZODone (DESYREL) tablet 100 mg  100 mg Oral QHS PRN Clapacs, Jackquline Denmark, MD   100 mg at 11/25/22 2106   PTA Medications: No medications prior to admission.    Patient Stressors: Financial difficulties   Loss of relationships and job   Marital or family  conflict   Occupational concerns   Traumatic event    Patient Strengths: Ability for insight  Average or above average intelligence  Capable of independent living  Forensic psychologist fund of knowledge  Motivation for treatment/growth  Religious Affiliation  Work skills   Treatment Modalities: Medication Management, Group therapy, Case management,  1 to 1 session with clinician, Psychoeducation, Recreational therapy.   Physician Treatment Plan for Primary Diagnosis: Major depressive disorder, recurrent severe without psychotic features (HCC) Long Term Goal(s): Improvement in symptoms so as ready for discharge   Short Term Goals: Compliance with prescribed medications will improve Ability to verbalize feelings will improve Ability to disclose and discuss suicidal ideas  Medication Management: Evaluate patient's response, side effects, and tolerance of medication regimen.  Therapeutic Interventions: 1 to 1 sessions, Unit Group sessions and Medication administration.  Evaluation of Outcomes: Progressing  Physician Treatment Plan for Secondary Diagnosis: Principal Problem:   Major depressive disorder, recurrent severe without psychotic features (HCC) Active Problems:   Suicidal ideation  Long Term Goal(s): Improvement in symptoms so as ready for discharge   Short Term Goals: Compliance with prescribed medications will improve Ability to verbalize feelings will improve Ability to disclose and discuss suicidal ideas     Medication Management:  Evaluate patient's response, side effects, and tolerance of medication regimen.  Therapeutic Interventions: 1 to 1 sessions, Unit Group sessions and Medication administration.  Evaluation of Outcomes: Progressing   RN Treatment Plan for Primary Diagnosis: Major depressive disorder, recurrent severe without psychotic features (HCC) Long Term Goal(s): Knowledge of disease and therapeutic regimen to maintain health will  improve  Short Term Goals: Ability to verbalize frustration and anger appropriately will improve, Ability to demonstrate self-control, Ability to participate in decision making will improve, Ability to verbalize feelings will improve, Ability to disclose and discuss suicidal ideas, Ability to identify and develop effective coping behaviors will improve, and Compliance with prescribed medications will improve  Medication Management: RN will administer medications as ordered by provider, will assess and evaluate patient's response and provide education to patient for prescribed medication. RN will report any adverse and/or side effects to prescribing provider.  Therapeutic Interventions: 1 on 1 counseling sessions, Psychoeducation, Medication administration, Evaluate responses to treatment, Monitor vital signs and CBGs as ordered, Perform/monitor CIWA, COWS, AIMS and Fall Risk screenings as ordered, Perform wound care treatments as ordered.  Evaluation of Outcomes: Progressing   LCSW Treatment Plan for Primary Diagnosis: Major depressive disorder, recurrent severe without psychotic features (HCC) Long Term Goal(s): Safe transition to appropriate next level of care at discharge, Engage patient in therapeutic group addressing interpersonal concerns.  Short Term Goals: Engage patient in aftercare planning with referrals and resources, Increase social support, Increase ability to appropriately verbalize feelings, Increase emotional regulation, Facilitate patient progression through stages of change regarding substance use diagnoses and concerns, and Increase skills for wellness and recovery  Therapeutic Interventions: Assess for all discharge needs, 1 to 1 time with Social worker, Explore available resources and support systems, Assess for adequacy in community support network, Educate family and significant other(s) on suicide prevention, Complete Psychosocial Assessment, Interpersonal group  therapy.  Evaluation of Outcomes: Progressing   Progress in Treatment: Attending groups: Yes. Participating in groups: Yes. Taking medication as prescribed: Yes. Toleration medication: Yes. Family/Significant other contact made: No, will contact:  Rocky Link Smith/pastor [phone]) Patient understands diagnosis: Yes. Discussing patient identified problems/goals with staff: Yes. Medical problems stabilized or resolved: Yes. Denies suicidal/homicidal ideation: Yes. Issues/concerns per patient self-inventory: No. Other: none  New problem(s) identified: No, Describe:  none  New Short Term/Long Term Goal(s): Patient to work towards detox, medication management for mood stabilization; elimination of SI thoughts; development of comprehensive mental wellness/sobriety plan. Update 11/28/22: No changes at this time.  Patient Goals:  No additional goals identified at this time. Patient to continue to work towards original goals identified in initial treatment team meeting. CSW will remain available to patient should they voice additional treatment goals. Update 11/28/22: No changes at this time.  Discharge Plan or Barriers: No psychosocial barriers identified at this time, patient to return to place of residence when appropriate for discharge. Update 11/28/22: No changes at this time.   Reason for Continuation of Hospitalization: Depression Medication stabilization Suicidal ideation  Estimated Length of Stay: 1-7 days Update 11/28/22: TBD  Last 3 [place] Suicide Severity Risk Score: Flowsheet Row Admission (Current) from 11/17/2022 in [facility] INPATIENT BEH[facility] MEDICINE Most recent reading at 11/20/2022  9:33 AM ED from 11/17/2022 in [facility] Emergency Department at [facility] Most recent reading at 11/16/2022  6:40 PM ED from 02/11/2022 in [facility] Emergency Department at [facility] Most recent reading at 02/11/2022  6:36 PM  C-SSRS RISK CATEGORY No Risk High Risk No Risk  Last PHQ 2/9 Scores:     No data to display          Scribe for Treatment Team: Marshell Levan, [place] 11/28/2022 9:14 AM

## 2022-11-28 NOTE — Progress Notes (Signed)
D - Patient visible in the milieu, socially appropriate, and pleasant.  Mood reported as moderately depressed and moderately anxious.  Noah Avery accepted his medications as ordered and appropriately requested PRN's for anxiety and sleep.  Suicidal thoughts denied.     A - Medications provided as ordered.  Remeron administered at bedtime.  Risperdal started for generalized anxiety with no endorsed side effects.  Therapeutic communication and emotional support provided.     R - The patient remained safe with a stable affect.  Pain, anxiety, and emotions well-managed with the assistance of medications.

## 2022-11-29 DIAGNOSIS — F332 Major depressive disorder, recurrent severe without psychotic features: Secondary | ICD-10-CM | POA: Diagnosis not present

## 2022-11-29 NOTE — [facility] Suicide Risk Assessment (Signed)
[facility] INPATIENT:  Family/Significant Other Suicide Prevention Education  Suicide Prevention Education:  Contact Attempts:  Noah Avery/pastor [phone]) , has been identified by the patient as the family member/significant other with whom the patient will be residing, and identified as the person(s) who will aid the patient in the event of a mental health crisis.  With written consent from the patient, two attempts were made to provide suicide prevention education, prior to and/or following the patient's discharge.  We were unsuccessful in providing suicide prevention education.  A suicide education pamphlet was given to the patient to share with family/significant other.  Date and time of first attempt:11/19/22 at 09:43  Date and time of second attempt:11/29/22 at 12:47   LCSWA attempted contact but the office was closed.   Noah Avery 11/29/2022, 12:46 PM

## 2022-11-29 NOTE — Progress Notes (Signed)
Patient ID: Noah Avery, male   DOB: 1971/06/08, 52 y.o.   MRN: [id number] Patient presents with anxious mood affect congruent. Patient states '' I'm doing okay. The doctor talks like I will be leaving Monday. '' Patient goes on to share he is concerned about his next steps with follow up. ''i just don't really have housing but my support system is in House. 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 ''  Pt denies any AH VH . He is eating and drinking well. Pt denies any SI HI . Pt is safe, will con't to monitor.

## 2022-11-29 NOTE — Group Note (Signed)
Date:  11/29/2022 Time:  4:21 PM  Group Topic/Focus:  Activity Group    Participation Level:  Active  Participation Quality:  Appropriate  Affect:  Appropriate  Cognitive:  Appropriate  Insight: Appropriate  Engagement in Group:  Engaged  Modes of Intervention:  Activity  Additional Comments:    [name] 11/29/2022, 4:21 PM

## 2022-11-29 NOTE — [facility] Group Notes (Signed)
[facility] Group Notes:  (Nursing/MHT/Case Management/Adjunct)  Date:  11/29/2022  Time:  9:57 AM  Type of Therapy:  Psychoeducational Skills  Participation Level:  Active  Participation Quality:  Appropriate  Affect:  Appropriate  Cognitive:  Appropriate  Insight:  Appropriate  Engagement in Group:  Engaged  Modes of Intervention:  Discussion, Education, and Exploration  Summary of Progress/Problems:  Patients were given education on the hallmark of positive reframing and how this can impact mood and mental health. Two poems were read which showed impacts of negative thinking. Pt shared and participated.    Noah Avery 11/29/2022, 9:57 AM

## 2022-11-29 NOTE — Progress Notes (Signed)
[facility] MD Progress Note  11/29/2022 12:50 PM Noah Avery  MRN:  [id number] Subjective: Noah Avery is seen on rounds.  He denies any side effects from his medications.  Nurses report no issues.  He has a lot of anxiety surrounding his discharge plan and living situation. Principal Problem: Major depressive disorder, recurrent severe without psychotic features (HCC) Diagnosis: Principal Problem:   Major depressive disorder, recurrent severe without psychotic features (HCC) Active Problems:   Suicidal ideation  Total Time spent with patient: 15 minutes  Past Psychiatric History: Depression  Past Medical History:  Past Medical History:  Diagnosis Date   Depression    H/O degenerative disc disease     Past Surgical History:  Procedure Laterality Date   BACK SURGERY     Family History: History reviewed. No pertinent family history. Family Psychiatric  History: Unremarkable Social History:  Social History   Substance and Sexual Activity  Alcohol Use Yes   Comment: occasional     Social History   Substance and Sexual Activity  Drug Use No    Social History   Socioeconomic History   Marital status: Divorced    Spouse name: Not on file   Number of children: Not on file   Years of education: Not on file   Highest education level: Not on file  Occupational History   Not on file  Tobacco Use   Smoking status: Every Day    Packs/day: 1.00    Years: 15.00    Additional pack years: 0.00    Total pack years: 15.00    Types: Cigarettes    Passive exposure: Current   Smokeless tobacco: Never  Vaping Use   Vaping Use: Never used  Substance and Sexual Activity   Alcohol use: Yes    Comment: occasional   Drug use: No   Sexual activity: Not on file  Other Topics Concern   Not on file  Social History Narrative   Not on file   Social Determinants of Health   Financial Resource Strain: Not on file  Food Insecurity: Food Insecurity Present (11/17/2022)   Hunger Vital Sign     Worried About Running Out of Food in the Last Year: Often true    Ran Out of Food in the Last Year: Often true  Transportation Needs: Unmet Transportation Needs (11/17/2022)   PRAPARE - Administrator, Civil Service (Medical): Yes    Lack of Transportation (Non-Medical): Yes  Physical Activity: Not on file  Stress: Not on file  Social Connections: Not on file   Additional Social History:                         Sleep: Good  Appetite:  Good  Current Medications: Current Facility-Administered Medications  Medication Dose Route Frequency Provider Last Rate Last Admin   acetaminophen (TYLENOL) tablet 650 mg  650 mg Oral Q6H PRN Penn, Cranston Neighbor, NP   650 mg at 11/21/22 2128   alum & mag hydroxide-simeth (MAALOX/MYLANTA) 200-200-20 MG/5ML suspension 30 mL  30 mL Oral Q4H PRN Penn, Cicely, NP       FLUoxetine (PROZAC) capsule 20 mg  20 mg Oral Daily Clapacs, John T, MD   20 mg at 11/29/22 0802   hydrOXYzine (ATARAX) tablet 25 mg  25 mg Oral TID PRN Mcneil Sober, NP   25 mg at 11/29/22 0940   lidocaine (LIDODERM) 5 % 1 patch  1 patch Transdermal Q24H Reggie Pile, MD  1 patch at 11/24/22 0853   magnesium hydroxide (MILK OF MAGNESIA) suspension 30 mL  30 mL Oral Daily PRN Penn, Cranston Neighbor, NP       methocarbamol (ROBAXIN) tablet 750 mg  750 mg Oral Q6H PRN Clapacs, Jackquline Denmark, MD   750 mg at 11/29/22 0804   mirtazapine (REMERON) tablet 15 mg  15 mg Oral QHS Clapacs, John T, MD   15 mg at 11/28/22 2110   naproxen (NAPROSYN) tablet 500 mg  500 mg Oral BID PRN Reggie Pile, MD   500 mg at 11/25/22 1039   nicotine (NICODERM CQ - dosed in mg/24 hours) patch 21 mg  21 mg Transdermal Daily Clapacs, Jackquline Denmark, MD   21 mg at 11/29/22 1610   nicotine polacrilex (NICORETTE) gum 2 mg  2 mg Oral PRN Clapacs, John T, MD       risperiDONE (RISPERDAL) tablet 0.5 mg  0.5 mg Oral [id number] Sarina Ill, DO   0.5 mg at 11/29/22 0802   traZODone (DESYREL) tablet 100 mg  100 mg Oral QHS PRN  Clapacs, Jackquline Denmark, MD   100 mg at 11/25/22 2106    Lab Results: No results found for this or any previous visit (from the past 48 hour(s)).  Blood Alcohol level:  Lab Results  Component Value Date   ETH <10 11/16/2022   ETH <5 01/04/2016    Metabolic Disorder Labs: Lab Results  Component Value Date   HGBA1C 5.3 02/11/2022   MPG 105.41 02/11/2022   No results found for: "PROLACTIN" Lab Results  Component Value Date   CHOL 155 11/15/2008   TRIG 155.0 (H) 11/15/2008   HDL 31.00 (L) 11/15/2008   CHOLHDL 5 11/15/2008   VLDL 31.0 11/15/2008   LDLCALC 93 11/15/2008    Physical Findings: AIMS: Facial and Oral Movements Muscles of Facial Expression: None, normal Lips and Perioral Area: None, normal Jaw: None, normal Tongue: None, normal,Extremity Movements Upper (arms, wrists, hands, fingers): None, normal Lower (legs, knees, ankles, toes): None, normal, Trunk Movements Neck, shoulders, hips: None, normal, Overall Severity Severity of abnormal movements (highest score from questions above): None, normal Incapacitation due to abnormal movements: None, normal Patient's awareness of abnormal movements (rate only patient's report): No Awareness, Dental Status Current problems with teeth and/or dentures?: No Does patient usually wear dentures?: No  CIWA:    COWS:     Musculoskeletal: Strength & Muscle Tone: within normal limits Gait & Station: normal Patient leans: N/A  Psychiatric Specialty Exam:  Presentation  General Appearance: No data recorded Eye Contact:No data recorded Speech:No data recorded Speech Volume:No data recorded Handedness:No data recorded  Mood and Affect  Mood:No data recorded Affect:No data recorded  Thought Process  Thought Processes:No data recorded Descriptions of Associations:No data recorded Orientation:No data recorded Thought Content:No data recorded History of Schizophrenia/Schizoaffective disorder:No  Duration of Psychotic  Symptoms:No data recorded Hallucinations:No data recorded Ideas of Reference:No data recorded Suicidal Thoughts:No data recorded Homicidal Thoughts:No data recorded  Sensorium  Memory:No data recorded Judgment:No data recorded Insight:No data recorded  Executive Functions  Concentration:No data recorded Attention Span:No data recorded Recall:No data recorded Fund of Knowledge:No data recorded Language:No data recorded  Psychomotor Activity  Psychomotor Activity:No data recorded  Assets  Assets:No data recorded  Sleep  Sleep:No data recorded   Blood pressure (!) 124/102, pulse (!) 111, temperature 97.7 F (36.5 C), temperature source Oral, resp. rate 18, height 5\' 10"  (1.778 m), weight 71.7 kg, SpO2 97 %. Body mass index is 22.67 kg/m.  Treatment Plan Summary: Daily contact with patient to assess and evaluate symptoms and progress in treatment, Medication management, and Plan continue current medication.  Sarina Ill, DO 11/29/2022, 12:50 PM

## 2022-11-30 NOTE — Progress Notes (Signed)
Patient alert and oriented x 4 no distress noted , he denies SI/HI/AVH interacting appropriately with staff, compliant with medication regimen. 15 minutes safety check maintained will continue to monitor

## 2022-11-30 NOTE — Group Note (Signed)
Recreation Therapy Group Note   Group Topic:Problem Solving  Group Date: 11/30/2022 Start Time: 1000 End Time: 1055 Facilitators: Rosina Lowenstein, LRT, CTRS Location:  Craft Room  Group Description: Life Boat. Patients were given the scenario that they are on a boat that is about to become shipwrecked, leaving them stranded on an [place]. They are asked to make a list of 15 different items that they want to take with them when they are stranded on the [place]. Patients are asked to rank their items from most important to least important, #1 being the most important and #15 being the least. Patients will work individually for the first round to come up with 15 items and then pair up with a peer(s) to condense their list and come up with one list of 15 items between the two of them. Patients or LRT will read aloud the 15 different items to the group after each round. LRT facilitated post-activity processing to discuss how this activity can be used in daily life post discharge.   Goal Area(s) Addressed:  Patient will identify priorities, wants and needs. Patient will communicate with LRT and peers. Patient will work collectively as a Administrator, Civil Service. Patient will work on Product manager.   Affect/Mood: Appropriate   Participation Level: Active and Engaged   Participation Quality: Independent   Behavior: Calm and Cooperative   Speech/Thought Process: Coherent   Insight: Good   Judgement: Good   Modes of Intervention: Activity and Group work   Patient Response to Interventions:  Attentive, Engaged, Interested , and Receptive   Education Outcome:  Acknowledges education   Clinical Observations/Individualized Feedback: Cheron was active in their participation of session activities and group discussion. Pt identified "food, water, soap, knife and rod" as some of the items he will bring with him on the [place]. Pt spontaneously contributed to group discussion on more than one occasion.  Pt interacted well with LRT and peers duration of session.   Plan: Continue to engage patient in RT group sessions 2-3x/week.   Rosina Lowenstein, LRT, CTRS 11/30/2022 11:41 AM

## 2022-11-30 NOTE — Group Note (Signed)
Date:  11/30/2022 Time:  9:53 PM  Group Topic/Focus:  Wrap-Up Group:   The focus of this group is to help patients review their daily goal of treatment and discuss progress on daily workbooks.    Participation Level:  Active  Participation Quality:  Appropriate and Attentive  Affect:  Appropriate and Excited  Cognitive:  Alert and Appropriate  Insight: Appropriate, Good, and Improving  Engagement in Group:  Developing/Improving  Modes of Intervention:  Limit-setting  Additional Comments:     Jacolyn Joaquin 11/30/2022, 9:53 PM

## 2022-11-30 NOTE — Progress Notes (Addendum)
Patient ID: Noah Avery, male   DOB: 1970/10/14, 52 y.o.   MRN: [id number] Patient presents with anxious mood affect congruent again today.. Patient states '' I'm doing fine. I slept well. The doctor said I would possibly be going home today so I just need to speak to him. '' Pt does report sinus allergy issues and requested claritin. MD notified of above. Pt denies any SI HI or AV Hallucinations. NO signs of pt responding to internal stimuli has been up and interactive in the mil[place]. Pt completed self inventory and rates his depression at 7/10 on scale 10 being worst 0 being none. Pt rates his anxiety at the same, 7/10 on scale 10 being worst 0 being hone. Pt rates his hopelessness at 6/10 on scale 10 being worst 0 being none. Pt is safe, will con't to monitor.

## 2022-11-30 NOTE — Progress Notes (Signed)
Patient is pleasant and cooperative. Denies any SI, HI, AVH. Medication compliant. Appropriate with staff and peers. Reports he is ok to be discharged on tomorrow. Feels better than he did last week. Did not request any prns. Noted in dayroom watching tv with peers. Encouragement and support provided. Safety checks maintained. Medications given as prescribed. Pt receptive and remains safe on unit with q 15 min checks.

## 2022-11-30 NOTE — Progress Notes (Signed)
[facility] MD Progress Note  11/30/2022 2:09 PM Noah Avery  MRN:  [id number] Subjective: Patient seen and chart reviewed.  No new complaints.  Mood is reasonably good depression is under control.  No suicidal thought.  No evidence psychosis.  Good self-care. Principal Problem: Major depressive disorder, recurrent severe without psychotic features (HCC) Diagnosis: Principal Problem:   Major depressive disorder, recurrent severe without psychotic features (HCC) Active Problems:   Suicidal ideation  Total Time spent with patient: 30 minutes  Past Psychiatric History: Past history of depression  Past Medical History:  Past Medical History:  Diagnosis Date   Depression    H/O degenerative disc disease     Past Surgical History:  Procedure Laterality Date   BACK SURGERY     Family History: History reviewed. No pertinent family history. Family Psychiatric  History: See previous Social History:  Social History   Substance and Sexual Activity  Alcohol Use Yes   Comment: occasional     Social History   Substance and Sexual Activity  Drug Use No    Social History   Socioeconomic History   Marital status: Divorced    Spouse name: Not on file   Number of children: Not on file   Years of education: Not on file   Highest education level: Not on file  Occupational History   Not on file  Tobacco Use   Smoking status: Every Day    Packs/day: 1.00    Years: 15.00    Additional pack years: 0.00    Total pack years: 15.00    Types: Cigarettes    Passive exposure: Current   Smokeless tobacco: Never  Vaping Use   Vaping Use: Never used  Substance and Sexual Activity   Alcohol use: Yes    Comment: occasional   Drug use: No   Sexual activity: Not on file  Other Topics Concern   Not on file  Social History Narrative   Not on file   Social Determinants of Health   Financial Resource Strain: Not on file  Food Insecurity: Food Insecurity Present (11/17/2022)   Hunger Vital Sign     Worried About Running Out of Food in the Last Year: Often true    Ran Out of Food in the Last Year: Often true  Transportation Needs: Unmet Transportation Needs (11/17/2022)   PRAPARE - Administrator, Civil Service (Medical): Yes    Lack of Transportation (Non-Medical): Yes  Physical Activity: Not on file  Stress: Not on file  Social Connections: Not on file   Additional Social History:                         Sleep: Fair  Appetite:  Fair  Current Medications: Current Facility-Administered Medications  Medication Dose Route Frequency Provider Last Rate Last Admin   acetaminophen (TYLENOL) tablet 650 mg  650 mg Oral Q6H PRN Penn, Cranston Neighbor, NP   650 mg at 11/21/22 2128   alum & mag hydroxide-simeth (MAALOX/MYLANTA) 200-200-20 MG/5ML suspension 30 mL  30 mL Oral Q4H PRN Penn, Cicely, NP       FLUoxetine (PROZAC) capsule 20 mg  20 mg Oral Daily Merit Maybee T, MD   20 mg at 11/30/22 4098   hydrOXYzine (ATARAX) tablet 25 mg  25 mg Oral TID PRN Mcneil Sober, NP   25 mg at 11/30/22 0934   lidocaine (LIDODERM) 5 % 1 patch  1 patch Transdermal Q24H Reggie Pile, MD  1 patch at 11/24/22 0853   magnesium hydroxide (MILK OF MAGNESIA) suspension 30 mL  30 mL Oral Daily PRN Penn, Cranston Neighbor, NP       methocarbamol (ROBAXIN) tablet 750 mg  750 mg Oral Q6H PRN Tristyn Demarest, Jackquline Denmark, MD   750 mg at 11/29/22 1852   mirtazapine (REMERON) tablet 15 mg  15 mg Oral QHS Eliora Nienhuis T, MD   15 mg at 11/29/22 2114   naproxen (NAPROSYN) tablet 500 mg  500 mg Oral BID PRN Reggie Pile, MD   500 mg at 11/25/22 1039   nicotine (NICODERM CQ - dosed in mg/24 hours) patch 21 mg  21 mg Transdermal Daily Yarexi Pawlicki, Jackquline Denmark, MD   21 mg at 11/30/22 9811   nicotine polacrilex (NICORETTE) gum 2 mg  2 mg Oral PRN Loy Little, Jackquline Denmark, MD       traZODone (DESYREL) tablet 100 mg  100 mg Oral QHS PRN Key Cen, Jackquline Denmark, MD   100 mg at 11/29/22 2114    Lab Results: No results found for this or any previous visit (from  the past 48 hour(s)).  Blood Alcohol level:  Lab Results  Component Value Date   ETH <10 11/16/2022   ETH <5 01/04/2016    Metabolic Disorder Labs: Lab Results  Component Value Date   HGBA1C 5.3 02/11/2022   MPG 105.41 02/11/2022   No results found for: "PROLACTIN" Lab Results  Component Value Date   CHOL 155 11/15/2008   TRIG 155.0 (H) 11/15/2008   HDL 31.00 (L) 11/15/2008   CHOLHDL 5 11/15/2008   VLDL 31.0 11/15/2008   LDLCALC 93 11/15/2008    Physical Findings: AIMS: Facial and Oral Movements Muscles of Facial Expression: None, normal Lips and Perioral Area: None, normal Jaw: None, normal Tongue: None, normal,Extremity Movements Upper (arms, wrists, hands, fingers): None, normal Lower (legs, knees, ankles, toes): None, normal, Trunk Movements Neck, shoulders, hips: None, normal, Overall Severity Severity of abnormal movements (highest score from questions above): None, normal Incapacitation due to abnormal movements: None, normal Patient's awareness of abnormal movements (rate only patient's report): No Awareness, Dental Status Current problems with teeth and/or dentures?: No Does patient usually wear dentures?: No  CIWA:    COWS:     Musculoskeletal: Strength & Muscle Tone: within normal limits Gait & Station: normal Patient leans: N/A  Psychiatric Specialty Exam:  Presentation  General Appearance: No data recorded Eye Contact:No data recorded Speech:No data recorded Speech Volume:No data recorded Handedness:No data recorded  Mood and Affect  Mood:No data recorded Affect:No data recorded  Thought Process  Thought Processes:No data recorded Descriptions of Associations:No data recorded Orientation:No data recorded Thought Content:No data recorded History of Schizophrenia/Schizoaffective disorder:No  Duration of Psychotic Symptoms:No data recorded Hallucinations:No data recorded Ideas of Reference:No data recorded Suicidal Thoughts:No data  recorded Homicidal Thoughts:No data recorded  Sensorium  Memory:No data recorded Judgment:No data recorded Insight:No data recorded  Executive Functions  Concentration:No data recorded Attention Span:No data recorded Recall:No data recorded Fund of Knowledge:No data recorded Language:No data recorded  Psychomotor Activity  Psychomotor Activity:No data recorded  Assets  Assets:No data recorded  Sleep  Sleep:No data recorded   Physical Exam: Physical Exam Vitals reviewed.  Constitutional:      Appearance: Normal appearance.  HENT:     Head: Normocephalic and atraumatic.     Mouth/Throat:     Pharynx: Oropharynx is clear.  Eyes:     Pupils: Pupils are equal, round, and reactive to light.  Cardiovascular:  Rate and Rhythm: Normal rate and regular rhythm.  Pulmonary:     Effort: Pulmonary effort is normal.     Breath sounds: Normal breath sounds.  Abdominal:     General: Abdomen is flat.     Palpations: Abdomen is soft.  Musculoskeletal:        General: Normal range of motion.  Skin:    General: Skin is warm and dry.  Neurological:     General: No focal deficit present.     Mental Status: He is alert. Mental status is at baseline.  Psychiatric:        Attention and Perception: Attention normal.        Mood and Affect: Mood normal.        Speech: Speech normal.        Behavior: Behavior normal.        Thought Content: Thought content normal.        Cognition and Memory: Cognition normal.        Judgment: Judgment normal.    Review of Systems  Constitutional: Negative.   HENT: Negative.    Eyes: Negative.   Respiratory: Negative.    Cardiovascular: Negative.   Gastrointestinal: Negative.   Musculoskeletal: Negative.   Skin: Negative.   Neurological: Negative.   Psychiatric/Behavioral: Negative.     Blood pressure (!) 138/100, pulse (!) 114, temperature 98.4 F (36.9 C), temperature source Oral, resp. rate 18, height 5\' 10"  (1.778 m), weight 71.7  kg, SpO2 96 %. Body mass index is 22.67 kg/m.   Treatment Plan Summary: Plan patient is stable and no longer requires inpatient treatment.  Reviewed this with patient and we will be discharging him tomorrow morning.  He was started on risperidone this weekend.  No indication.  Discontinue medicine.  Mordecai Rasmussen, MD 11/30/2022, 2:09 PM

## 2022-12-01 LAB — LIPID PANEL
Cholesterol: 264 mg/dL — ABNORMAL HIGH (ref 0–200)
HDL: 44 mg/dL (ref >40)
LDL Cholesterol: 173 mg/dL — ABNORMAL HIGH (ref 0–99)
Total CHOL/HDL Ratio: 6 RATIO
Triglycerides: 237 mg/dL — ABNORMAL HIGH (ref <150)
VLDL: 47 mg/dL — ABNORMAL HIGH (ref 0–40)

## 2022-12-01 MED ORDER — FLUOXETINE HCL 20 MG PO CAPS: 20.0000 mg | ORAL_CAPSULE | Freq: Every day | ORAL | 1 refills | Status: AC

## 2022-12-01 MED ORDER — NICOTINE POLACRILEX 2 MG MT GUM: 2.0000 mg | CHEWING_GUM | OROMUCOSAL | 1 refills | Status: AC | PRN

## 2022-12-01 MED ORDER — MIRTAZAPINE 15 MG PO TABS: 15.0000 mg | ORAL_TABLET | Freq: Every day | ORAL | 1 refills | Status: AC

## 2022-12-01 MED ORDER — TRAZODONE HCL 100 MG PO TABS: 100.0000 mg | ORAL_TABLET | Freq: Every evening | ORAL | 1 refills | Status: AC | PRN

## 2022-12-01 MED ORDER — METHOCARBAMOL 750 MG PO TABS: 750.0000 mg | ORAL_TABLET | Freq: Four times a day (QID) | ORAL | 1 refills | Status: AC | PRN

## 2022-12-01 MED ORDER — NICOTINE 21 MG/24HR TD PT24: 21.0000 mg | MEDICATED_PATCH | Freq: Every day | TRANSDERMAL | 1 refills | Status: AC

## 2022-12-01 MED ORDER — NAPROXEN 500 MG PO TABS: 500.0000 mg | ORAL_TABLET | Freq: Two times a day (BID) | ORAL | 1 refills | Status: AC | PRN

## 2022-12-01 MED ORDER — LIDOCAINE 5 % EX PTCH: 1.0000 | MEDICATED_PATCH | CUTANEOUS | 1 refills | Status: AC

## 2022-12-01 MED ORDER — HYDROXYZINE HCL 25 MG PO TABS: 25.0000 mg | ORAL_TABLET | Freq: Three times a day (TID) | ORAL | 1 refills | Status: AC | PRN

## 2022-12-01 NOTE — Group Note (Signed)
Date:  12/01/2022 Time:  9:35 AM  Group Topic/Focus:  Mindfulness Group    Participation Level:  Active  Participation Quality:  Appropriate  Affect:  Appropriate  Cognitive:  Appropriate  Insight: Appropriate  Engagement in Group:  Engaged  Modes of Intervention:  Activity  Additional Comments:    Mary Sella Trisha Ken 12/01/2022, 9:35 AM

## 2022-12-01 NOTE — Progress Notes (Signed)
  [facility] Adult Case Management Discharge Plan :  Will you be returning to the same living situation after discharge:  No. At discharge, do you have transportation home?: Yes,  CSW to assist with transportation arrangements. Do you have the ability to pay for your medications: Yes,  [place] Medicaid Prepaid Health Plan/[place] Medicaid [place] [employer].  Release of information consent forms completed and in the chart;  Patient's signature needed at discharge.  Patient to Follow up at:  Follow-up Information     [facility] Follow up.   Why: Your appointment is scheduled for Monday, 12/07/22 at 11AM. Thanks! Contact information: [address] [place] [address] [phone]                 Next level of care provider has access to [facility] Link:no  Safety Planning and Suicide Prevention discussed: Yes,  SPE completed with pt.      Has patient been referred to the Quitline?: Patient refused referral for treatment  Patient has been referred for addiction treatment: Patient refused referral for treatment.  Glenis Smoker, LCSW 12/01/2022, 9:23 AM

## 2022-12-01 NOTE — Plan of Care (Signed)
  Problem: Coping: Goal: Level of anxiety will decrease Outcome: Progressing   Problem: Safety: Goal: Ability to remain free from injury will improve Outcome: Progressing   Problem: Education: Goal: Knowledge of the prescribed therapeutic regimen will improve Outcome: Progressing   Problem: Activity: Goal: Imbalance in normal sleep/wake cycle will improve Outcome: Progressing   Problem: Coping: Goal: Coping ability will improve Outcome: Progressing

## 2022-12-01 NOTE — Progress Notes (Signed)
Patient pleasant and cooperative on approach. Denies SI,HI and AVH. Verbalized understanding discharge instructions,prescriptions and follow up care. 7 days medicines given to patient. All belongings returned from BMU locker. Suicide safety plan filled by patient and placed in chart. Copy given to patient.Patient escorted out by staff and transported by cab. 

## 2022-12-01 NOTE — BHH Counselor (Signed)
CSW met with pt briefly to discuss discharge/aftercare plans. Pt shared that he will be going to [place] when he discharges and that he does need assistance with transportation. He still plans to follow up with [facility]. Pt has clothes to wear at discharge. He is a smoker who declined interest in cessation at this time. He also endorsed some drinking but denied need for any substance use specific services. When asked if there was anything else that he needed from CSW, pt stated that he would like his insurance information. CSW informed him that he would look to see if this was present in his chart. Pt agreed. No other concerns expressed. Contact ended without incident.   CSW contacted [facility] and scheduled appointment.   Pt's insurance information pulled from his facesheet. Information was printed and then cut down to just the insurance information.   CSW met with pt to go over aftercare plans and give insurance information. Pt received insurance information and was updated regarding his appointment. No other concerns expressed. Contact ended without incident.   Vilma Meckel. Algis Greenhouse, MSW, LCSW, LCAS 12/01/2022 9:23 AM

## 2022-12-01 NOTE — [facility] Suicide Risk Assessment (Signed)
[facility] Discharge Suicide Risk Assessment   Principal Problem: Major depressive disorder, recurrent severe without psychotic features (HCC) Discharge Diagnoses: Principal Problem:   Major depressive disorder, recurrent severe without psychotic features (HCC) Active Problems:   Suicidal ideation   Total Time spent with patient: 30 minutes  Musculoskeletal: Strength & Muscle Tone: within normal limits Gait & Station: normal Patient leans: N/A  Psychiatric Specialty Exam  Presentation  General Appearance: No data recorded Eye Contact:No data recorded Speech:No data recorded Speech Volume:No data recorded Handedness:No data recorded  Mood and Affect  Mood:No data recorded Duration of Depression Symptoms: Greater than two weeks  Affect:No data recorded  Thought Process  Thought Processes:No data recorded Descriptions of Associations:No data recorded Orientation:No data recorded Thought Content:No data recorded History of Schizophrenia/Schizoaffective disorder:No  Duration of Psychotic Symptoms:No data recorded Hallucinations:No data recorded Ideas of Reference:No data recorded Suicidal Thoughts:No data recorded Homicidal Thoughts:No data recorded  Sensorium  Memory:No data recorded Judgment:No data recorded Insight:No data recorded  Executive Functions  Concentration:No data recorded Attention Span:No data recorded Recall:No data recorded Fund of Knowledge:No data recorded Language:No data recorded  Psychomotor Activity  Psychomotor Activity:No data recorded  Assets  Assets:No data recorded  Sleep  Sleep:No data recorded  Physical Exam: Physical Exam Vitals reviewed.  Constitutional:      Appearance: Normal appearance.  HENT:     Head: Normocephalic and atraumatic.     Mouth/Throat:     Pharynx: Oropharynx is clear.  Eyes:     Pupils: Pupils are equal, round, and reactive to light.  Cardiovascular:     Rate and Rhythm: Normal rate and regular  rhythm.  Pulmonary:     Effort: Pulmonary effort is normal.     Breath sounds: Normal breath sounds.  Abdominal:     General: Abdomen is flat.     Palpations: Abdomen is soft.  Musculoskeletal:        General: Normal range of motion.  Skin:    General: Skin is warm and dry.  Neurological:     General: No focal deficit present.     Mental Status: He is alert. Mental status is at baseline.  Psychiatric:        Mood and Affect: Mood normal.        Thought Content: Thought content normal.    Review of Systems  Constitutional: Negative.   HENT: Negative.    Eyes: Negative.   Respiratory: Negative.    Cardiovascular: Negative.   Gastrointestinal: Negative.   Musculoskeletal:  Positive for joint pain.  Skin: Negative.   Neurological: Negative.   Psychiatric/Behavioral: Negative.     Blood pressure (!) 135/112, pulse (!) 107, temperature 98.4 F (36.9 C), temperature source Oral, resp. rate 18, height 5\' 10"  (1.778 m), weight 71.7 kg, SpO2 96 %. Body mass index is 22.67 kg/m.  Mental Status Per Nursing Assessment::   On Admission:  NA  Demographic Factors:  Male and Low socioeconomic status  Loss Factors: Financial problems/change in socioeconomic status  Historical Factors: NA  Risk Reduction Factors:   Positive coping skills or problem solving skills  Continued Clinical Symptoms:  Depression:   Impulsivity  Cognitive Features That Contribute To Risk:  None    Suicide Risk:  Minimal: No identifiable suicidal ideation.  Patients presenting with no risk factors but with morbid ruminations; may be classified as minimal risk based on the severity of the depressive symptoms    Plan Of Care/Follow-up recommendations:  Other:  follow up [facility] and continue medication  Mordecai Rasmussen, MD 12/01/2022, 8:54 AM

## 2022-12-02 NOTE — Discharge Summary (Signed)
Physician Discharge Summary Note  Patient:  Noah Avery is an 52 y.o., male MRN:  [id number] DOB:  1971-03-03 Patient phone:  [phone] (home)  Patient address:   [place] [address],  Total Time spent with patient: 30 minutes  Date of Admission:  11/17/2022 Date of Discharge: 12/01/2022  Reason for Admission: Admitted after presenting to the emergency room with complaints of depression and suicidal ideation and homelessness  Principal Problem: Major depressive disorder, recurrent severe without psychotic features (HCC) Discharge Diagnoses: Principal Problem:   Major depressive disorder, recurrent severe without psychotic features (HCC) Active Problems:   Suicidal ideation   Past Psychiatric History: Longstanding history of homelessness and depressive symptoms  Past Medical History:  Past Medical History:  Diagnosis Date   Depression    H/O degenerative disc disease     Past Surgical History:  Procedure Laterality Date   BACK SURGERY     Family History: History reviewed. No pertinent family history. Family Psychiatric  History: See previous Social History:  Social History   Substance and Sexual Activity  Alcohol Use Yes   Comment: occasional     Social History   Substance and Sexual Activity  Drug Use No    Social History   Socioeconomic History   Marital status: Divorced    Spouse name: Not on file   Number of children: Not on file   Years of education: Not on file   Highest education level: Not on file  Occupational History   Not on file  Tobacco Use   Smoking status: Every Day    Packs/day: 1.00    Years: 15.00    Additional pack years: 0.00    Total pack years: 15.00    Types: Cigarettes    Passive exposure: Current   Smokeless tobacco: Never  Vaping Use   Vaping Use: Never used  Substance and Sexual Activity   Alcohol use: Yes    Comment: occasional   Drug use: No   Sexual activity: Not on file  Other Topics Concern   Not on  file  Social History Narrative   Not on file   Social Determinants of Health   Financial Resource Strain: Not on file  Food Insecurity: Food Insecurity Present (11/17/2022)   Hunger Vital Sign    Worried About Running Out of Food in the Last Year: Often true    Ran Out of Food in the Last Year: Often true  Transportation Needs: Unmet Transportation Needs (11/17/2022)   PRAPARE - Administrator, Civil Service (Medical): Yes    Lack of Transportation (Non-Medical): Yes  Physical Activity: Not on file  Stress: Not on file  Social Connections: Not on file    Hospital Course: Admitted to psychiatric unit.  15-minute checks continued.  Started on antidepressant medicine.  Engaged in individual and group therapy regularly.  Patient did not display any dangerous behavior in the hospital and was cooperative with treatment.  Struggled to find a place to stay.  Ultimately was discharged with plans to go back for the moment to [facility] and see if they can help him find a place to live.  Prescriptions provided outpatient referral made.  Physical Findings: AIMS: Facial and Oral Movements Muscles of Facial Expression: None, normal Lips and Perioral Area: None, normal Jaw: None, normal Tongue: None, normal,Extremity Movements Upper (arms, wrists, hands, fingers): None, normal Lower (legs, knees, ankles, toes): None, normal, Trunk Movements Neck, shoulders, hips: None, normal, Overall Severity Severity of abnormal  movements (highest score from questions above): None, normal Incapacitation due to abnormal movements: None, normal Patient's awareness of abnormal movements (rate only patient's report): No Awareness, Dental Status Current problems with teeth and/or dentures?: No Does patient usually wear dentures?: No  CIWA:    COWS:     Musculoskeletal: Strength & Muscle Tone: within normal limits Gait & Station: normal Patient leans: N/A   Psychiatric Specialty  Exam:  Presentation  General Appearance: No data recorded Eye Contact:No data recorded Speech:No data recorded Speech Volume:No data recorded Handedness:No data recorded  Mood and Affect  Mood:No data recorded Affect:No data recorded  Thought Process  Thought Processes:No data recorded Descriptions of Associations:No data recorded Orientation:No data recorded Thought Content:No data recorded History of Schizophrenia/Schizoaffective disorder:No  Duration of Psychotic Symptoms:No data recorded Hallucinations:No data recorded Ideas of Reference:No data recorded Suicidal Thoughts:No data recorded Homicidal Thoughts:No data recorded  Sensorium  Memory:No data recorded Judgment:No data recorded Insight:No data recorded  Executive Functions  Concentration:No data recorded Attention Span:No data recorded Recall:No data recorded Fund of Knowledge:No data recorded Language:No data recorded  Psychomotor Activity  Psychomotor Activity:No data recorded  Assets  Assets:No data recorded  Sleep  Sleep:No data recorded   Physical Exam: Physical Exam Constitutional:      Appearance: Normal appearance.  HENT:     Head: Normocephalic and atraumatic.     Mouth/Throat:     Pharynx: Oropharynx is clear.  Eyes:     Pupils: Pupils are equal, round, and reactive to light.  Cardiovascular:     Rate and Rhythm: Normal rate and regular rhythm.  Pulmonary:     Effort: Pulmonary effort is normal.     Breath sounds: Normal breath sounds.  Abdominal:     General: Abdomen is flat.     Palpations: Abdomen is soft.  Musculoskeletal:        General: Normal range of motion.  Skin:    General: Skin is warm and dry.  Neurological:     General: No focal deficit present.     Mental Status: He is alert. Mental status is at baseline.  Psychiatric:        Mood and Affect: Mood normal.        Thought Content: Thought content normal.    Review of Systems  Constitutional: Negative.    HENT: Negative.    Eyes: Negative.   Respiratory: Negative.    Cardiovascular: Negative.   Gastrointestinal: Negative.   Musculoskeletal: Negative.   Skin: Negative.   Neurological: Negative.   Psychiatric/Behavioral: Negative.     Blood pressure (!) 135/112, pulse (!) 107, temperature 98.4 F (36.9 C), temperature source Oral, resp. rate 18, height 5\' 10"  (1.778 m), weight 71.7 kg, SpO2 96 %. Body mass index is 22.67 kg/m.   Social History   Tobacco Use  Smoking Status Every Day   Packs/day: 1.00   Years: 15.00   Additional pack years: 0.00   Total pack years: 15.00   Types: Cigarettes   Passive exposure: Current  Smokeless Tobacco Never   Tobacco Cessation:  A prescription for an FDA-approved tobacco cessation medication provided at discharge   Blood Alcohol level:  Lab Results  Component Value Date   [facility] <10 11/16/2022   ETH <5 01/04/2016    Metabolic Disorder Labs:  Lab Results  Component Value Date   HGBA1C 5.3 02/11/2022   MPG 105.41 02/11/2022   No results found for: "PROLACTIN" Lab Results  Component Value Date   CHOL 264 (H) 12/01/2022  TRIG 237 (H) 12/01/2022   HDL 44 12/01/2022   CHOLHDL 6.0 12/01/2022   VLDL 47 (H) 12/01/2022   LDLCALC 173 (H) 12/01/2022   LDLCALC 93 11/15/2008    See Psychiatric Specialty Exam and Suicide Risk Assessment completed by Attending Physician prior to discharge.  Discharge destination:  Home  Is patient on multiple antipsychotic therapies at discharge:  No   Has Patient had three or more failed trials of antipsychotic monotherapy by history:  No  Recommended Plan for Multiple Antipsychotic Therapies: NA  Discharge Instructions     Diet - low sodium heart healthy   Complete by: As directed    Increase activity slowly   Complete by: As directed       Allergies as of 12/01/2022       Reactions   Silver Nitrate         Medication List     TAKE these medications      Indication  FLUoxetine 20  MG capsule Commonly known as: PROZAC Take 1 capsule (20 mg total) by mouth daily.  Indication: Depression   hydrOXYzine 25 MG tablet Commonly known as: ATARAX Take 1 tablet (25 mg total) by mouth 3 (three) times daily as needed for anxiety.  Indication: Feeling Anxious   lidocaine 5 % Commonly known as: LIDODERM Place 1 patch onto the skin daily. Remove & Discard patch within 12 hours or as directed by MD  Indication: Pain   methocarbamol 750 MG tablet Commonly known as: ROBAXIN Take 1 tablet (750 mg total) by mouth every 6 (six) hours as needed for muscle spasms.  Indication: Musculoskeletal Pain   mirtazapine 15 MG tablet Commonly known as: REMERON Take 1 tablet (15 mg total) by mouth at bedtime.  Indication: Major Depressive Disorder, Sleep   naproxen 500 MG tablet Commonly known as: NAPROSYN Take 1 tablet (500 mg total) by mouth 2 (two) times daily as needed for moderate pain.  Indication: Pain   nicotine 21 mg/24hr patch Commonly known as: NICODERM CQ - dosed in mg/24 hours Place 1 patch (21 mg total) onto the skin daily.  Indication: Nicotine Addiction   nicotine polacrilex 2 MG gum Commonly known as: NICORETTE Take 1 each (2 mg total) by mouth as needed for smoking cessation.  Indication: Nicotine Addiction   traZODone 100 MG tablet Commonly known as: DESYREL Take 1 tablet (100 mg total) by mouth at bedtime as needed for sleep.  Indication: Trouble Sleeping        Follow-up Information     [facility] Follow up.   Why: Your appointment is scheduled for Monday, 12/07/22 at 11AM. Thanks! Contact information: [address] [place] [address] [phone]                 Follow-up recommendations:  Other:  Recommend outpatient follow-up with [facility]  Comments: Prescriptions and supply of medicine provided.  Signed: Mordecai Rasmussen, MD 12/02/2022, 3:27 PM

## 2022-12-07 ENCOUNTER — Other Ambulatory Visit: Payer: Self-pay

## 2022-12-07 ENCOUNTER — Emergency Department
Admission: EM | Admit: 2022-12-07 | Discharge: 2022-12-07 | Disposition: A | Discharge status: Home / Self Care | Payer: Medicaid Other | Attending: Emergency Medicine | Admitting: Emergency Medicine

## 2022-12-07 DIAGNOSIS — S90822A Blister (nonthermal), left foot, initial encounter: Secondary | ICD-10-CM | POA: Diagnosis not present

## 2022-12-07 DIAGNOSIS — Z5900 Homelessness unspecified: Secondary | ICD-10-CM | POA: Diagnosis not present

## 2022-12-07 DIAGNOSIS — X58XXXA Exposure to other specified factors, initial encounter: Secondary | ICD-10-CM | POA: Insufficient documentation

## 2022-12-07 DIAGNOSIS — S90821A Blister (nonthermal), right foot, initial encounter: Secondary | ICD-10-CM | POA: Insufficient documentation

## 2022-12-07 DIAGNOSIS — R238 Other skin changes: Secondary | ICD-10-CM

## 2022-12-07 NOTE — ED Provider Notes (Signed)
   [facility] Provider Note    Event Date/Time   First MD Initiated Contact with Patient 12/07/22 1321     (approximate)   History   Foot Problem   HPI  Noah Avery is a 52 y.o. male who presents with complaints of blisters on the bottoms of the forefoot bilaterally, he reports his feet got wet yesterday in the rain, he is homeless and has been ambulating significantly.  No other concerns     Physical Exam   Triage Vital Signs: ED Triage Vitals  Enc Vitals Group     BP 12/07/22 1321 (!) 132/99     Pulse Rate 12/07/22 1321 (!) 116     Resp 12/07/22 1321 18     Temp 12/07/22 1321 98.3 F (36.8 C)     Temp Source 12/07/22 1321 Oral     SpO2 12/07/22 1321 96 %     Weight 12/07/22 1320 77.1 kg (169 lb 15.6 oz)     Height 12/07/22 1320 1.778 m (5\' 10" )     Head Circumference --      Peak Flow --      Pain Score 12/07/22 1320 3     Pain Loc --      Pain Edu? --      Excl. in GC? --     Most recent vital signs: Vitals:   12/07/22 1321  BP: (!) 132/99  Pulse: (!) 116  Resp: 18  Temp: 98.3 F (36.8 C)  SpO2: 96%     General: Awake, no distress.  CV:  Good peripheral perfusion. Resp:  Normal effort.  Abd:  No distention.  Other:  Plantar surface of the forefoot bilaterally with 2 large blisters, no evidence of infection   ED Results / Procedures / Treatments   Labs (all labs ordered are listed, but only abnormal results are displayed) Labs Reviewed - No data to display   EKG     RADIOLOGY     PROCEDURES:  Critical Care performed:   Procedures   MEDICATIONS ORDERED IN ED: Medications - No data to display   IMPRESSION / MDM / ASSESSMENT AND PLAN / ED COURSE  I reviewed the triage vital signs and the nursing notes. Patient's presentation is most consistent with acute, uncomplicated illness.  Patient with blisters to the foot from walking in wet shoes, dry gauze provided, socks provided, no concern for infection at  this time        FINAL CLINICAL IMPRESSION(S) / ED DIAGNOSES   Final diagnoses:  Blisters of multiple sites     Rx / DC Orders   ED Discharge Orders     None        Note:  This document was prepared using Dragon voice recognition software and may include unintentional dictation errors.   Jene Every, MD 12/07/22 1434

## 2022-12-07 NOTE — ED Triage Notes (Signed)
Blisters to bilateral balls of feet.  Onset today.

## 2022-12-10 DIAGNOSIS — I10 Essential (primary) hypertension: Secondary | ICD-10-CM | POA: Insufficient documentation

## 2022-12-10 DIAGNOSIS — F4323 Adjustment disorder with mixed anxiety and depressed mood: Secondary | ICD-10-CM | POA: Insufficient documentation

## 2022-12-10 DIAGNOSIS — Z79899 Other long term (current) drug therapy: Secondary | ICD-10-CM | POA: Diagnosis not present

## 2022-12-10 DIAGNOSIS — F17210 Nicotine dependence, cigarettes, uncomplicated: Secondary | ICD-10-CM | POA: Insufficient documentation

## 2022-12-10 NOTE — ED Triage Notes (Signed)
Suicidal ideation with no plan. Says he has had multiple lifestyle stressors lately with loss of housing being his breaking point.   Says he has no access to weapons but has 2 sleeping meds in his belongings.

## 2022-12-11 ENCOUNTER — Other Ambulatory Visit: Payer: Self-pay

## 2022-12-11 ENCOUNTER — Emergency Department
Admission: EM | Admit: 2022-12-11 | Discharge: 2022-12-11 | Disposition: A | Discharge status: Home / Self Care | Payer: Medicaid Other | Attending: Emergency Medicine | Admitting: Emergency Medicine

## 2022-12-11 DIAGNOSIS — F4323 Adjustment disorder with mixed anxiety and depressed mood: Secondary | ICD-10-CM

## 2022-12-11 DIAGNOSIS — F32A Depression, unspecified: Secondary | ICD-10-CM

## 2022-12-11 LAB — COMPREHENSIVE METABOLIC PANEL
ALT: 19 U/L (ref 0–44)
AST: 27 U/L (ref 15–41)
Albumin: 4.3 g/dL (ref 3.5–5.0)
Alkaline Phosphatase: 73 U/L (ref 38–126)
Anion gap: 10 (ref 5–15)
BUN: 16 mg/dL (ref 6–20)
CO2: 25 mmol/L (ref 22–32)
Calcium: 8.9 mg/dL (ref 8.9–10.3)
Chloride: 102 mmol/L (ref 98–111)
Creatinine, Ser: 1.34 mg/dL — ABNORMAL HIGH (ref 0.61–1.24)
GFR, Estimated: >60 mL/min (ref >60)
Glucose, Bld: 115 mg/dL — ABNORMAL HIGH (ref 70–99)
Potassium: 3.6 mmol/L (ref 3.5–5.1)
Sodium: 137 mmol/L (ref 135–145)
Total Bilirubin: 0.7 mg/dL (ref 0.3–1.2)
Total Protein: 7 g/dL (ref 6.5–8.1)

## 2022-12-11 LAB — CBC
HCT: 40.5 % (ref 39.0–52.0)
Hemoglobin: 13.9 g/dL (ref 13.0–17.0)
MCH: 31.4 pg (ref 26.0–34.0)
MCHC: 34.3 g/dL (ref 30.0–36.0)
MCV: 91.6 fL (ref 80.0–100.0)
Platelets: 236 10*3/uL (ref 150–400)
RBC: 4.42 MIL/uL (ref 4.22–5.81)
RDW: 13.2 % (ref 11.5–15.5)
WBC: 8.8 10*3/uL (ref 4.0–10.5)
nRBC: 0 % (ref 0.0–0.2)

## 2022-12-11 LAB — URINE DRUG SCREEN, QUALITATIVE (ARMC ONLY)
Amphetamines, Ur Screen: NOT DETECTED
Barbiturates, Ur Screen: NOT DETECTED
Benzodiazepine, Ur Scrn: NOT DETECTED
Cannabinoid 50 Ng, Ur ~~LOC~~: NOT DETECTED
Cocaine Metabolite,Ur ~~LOC~~: NOT DETECTED
MDMA (Ecstasy)Ur Screen: NOT DETECTED
Methadone Scn, Ur: NOT DETECTED
Opiate, Ur Screen: NOT DETECTED
Phencyclidine (PCP) Ur S: NOT DETECTED
Tricyclic, Ur Screen: NOT DETECTED

## 2022-12-11 LAB — ETHANOL: Alcohol, Ethyl (B): <10 mg/dL (ref <10)

## 2022-12-11 LAB — ACETAMINOPHEN LEVEL: Acetaminophen (Tylenol), Serum: <10 ug/mL — ABNORMAL LOW (ref 10–30)

## 2022-12-11 LAB — SALICYLATE LEVEL: Salicylate Lvl: <7 mg/dL — ABNORMAL LOW (ref 7.0–30.0)

## 2022-12-11 MED ORDER — FLUOXETINE HCL 20 MG PO CAPS
20.0000 mg | ORAL_CAPSULE | Freq: Every day | ORAL | Status: DC
  Administered 2022-12-11: 20 mg via ORAL
  Filled 2022-12-11: qty 1

## 2022-12-11 MED ORDER — TRAZODONE HCL 100 MG PO TABS: 100.0000 mg | ORAL_TABLET | Freq: Every evening | ORAL | Status: DC | PRN

## 2022-12-11 MED ORDER — HYDROXYZINE HCL 25 MG PO TABS: 25.0000 mg | ORAL_TABLET | Freq: Three times a day (TID) | ORAL | Status: DC | PRN

## 2022-12-11 NOTE — ED Notes (Signed)
Phone Smart watch  2 black shoes  2 white socks 1 gray shirt  1 brown shorts [employer] in duffel bag. Denies cash but says theres a little spare change.  Toiletries and shower supplies in bag.   Assortment of other items in personal personal luggage.

## 2022-12-11 NOTE — ED Notes (Signed)
VOL/Consult Ordered/Pending TOC Placement

## 2022-12-11 NOTE — ED Notes (Signed)
Pt talking to [facility] staff on phone.

## 2022-12-11 NOTE — ED Notes (Signed)
Print production planner for transportation to [employer]. [facility] [employer] will be here sometime this evening to transport pt

## 2022-12-11 NOTE — BH Assessment (Signed)
Comprehensive Clinical Assessment (CCA) Note  12/11/2022 Noah Avery [id number]  Chief Complaint: Patient is a 52 year old male presenting to [facility] ED voluntarily. Per triage note Suicidal ideation with no plan. Says he has had multiple lifestyle stressors lately with loss of housing being his breaking point. Says he has no access to weapons but has 2 sleeping meds in his belongings. During assessment patient appears alert and oriented x4, calm and cooperative. Patient reports "I'm debating on hurting myself." Patient was recently discharged from [facility] on 12/01/22 with similar presentation and reports that he followed up with [facility] "I went there for the assessment, [facility] said they were full right now and they were supposed to be referring me somewhere else but my phone got cut off, so I'm in limbo." Patient reports he current issue is homelessness and financial. Patient continues to report SI with no plan, he denies HI/AH/VH. Chief Complaint  Patient presents with   Suicidal   Visit Diagnosis: Major Depressive Disorder, recurrent episode, severe    CCA Screening, Triage and Referral (STR)  Patient Reported Information How did you hear about [place]? Self  Referral name: No data recorded Referral phone number: No data recorded  Whom do you see for routine medical problems? No data recorded Practice/Facility Name: No data recorded Practice/Facility Phone Number: No data recorded Name of Contact: No data recorded Contact Number: No data recorded Contact Fax Number: No data recorded Prescriber Name: No data recorded Prescriber Address (if known): No data recorded  What Is the Reason for Your Visit/Call Today? Suicidal ideation with no plan. Says he has had multiple lifestyle stressors lately with loss of housing being his breaking point.      Says he has no access to weapons but has 2 sleeping meds in his belongings  How Long Has This Been Causing You Problems? > than 6 months  What Do You  Feel Would Help You the Most Today? Treatment for Depression or other mood problem   Have You Recently Been in Any Inpatient Treatment (Hospital/Detox/Crisis Center/28-Day Program)? No data recorded Name/Location of Program/Hospital:No data recorded How Long Were You There? No data recorded When Were You Discharged? No data recorded  Have You Ever Received Services From [facility] Before? No data recorded Who Do You See at [facility]? No data recorded  Have You Recently Had Any Thoughts About Hurting Yourself? Yes  Are You Planning to Commit Suicide/Harm Yourself At This time? No   Have you Recently Had Thoughts About Hurting Someone [name]? No  Explanation: No data recorded  Have You Used Any Alcohol or Drugs in the Past 24 Hours? No  How Long Ago Did You Use Drugs or Alcohol? No data recorded What Did You Use and How Much? No data recorded  Do You Currently Have a Therapist/Psychiatrist? No  Name of Therapist/Psychiatrist: No data recorded  Have You Been Recently Discharged From Any Office Practice or Programs? No  Explanation of Discharge From Practice/Program: No data recorded    CCA Screening Triage Referral Assessment Type of Contact: Face-to-Face  Is this Initial or Reassessment? No data recorded Date Telepsych consult ordered in [facility]:  No data recorded Time Telepsych consult ordered in [facility]:  No data recorded  Patient Reported Information Reviewed? No data recorded Patient Left Without Being Seen? No data recorded Reason for Not Completing Assessment: No data recorded  Collateral Involvement: No data recorded  Does Patient Have a Court Appointed Legal Guardian? No data recorded Name and Contact  of Legal Guardian: No data recorded If Minor and Not Living with Parent(s), Who has Custody? No data recorded Is CPS involved or ever been involved? Never  Is APS involved or ever been involved? Never   Patient Determined To Be At Risk for Harm To Self or Others  Based on Review of Patient Reported Information or Presenting Complaint? No  Method: No data recorded Availability of Means: No data recorded Intent: No data recorded Notification Required: No data recorded Additional Information for Danger to Others Potential: No data recorded Additional Comments for Danger to Others Potential: No data recorded Are There Guns or Other Weapons in Your Home? No  Types of Guns/Weapons: No data recorded Are These Weapons Safely Secured?                            No data recorded Who Could Verify You Are Able To Have These Secured: No data recorded Do You Have any Outstanding Charges, Pending Court Dates, Parole/Probation? No data recorded Contacted To Inform of Risk of Harm To Self or Others: No data recorded  Location of Assessment: [facility] ED   Does Patient Present under Involuntary Commitment? No  IVC Papers Initial File Date: No data recorded  [place] of Residence: Noah Avery   Patient Currently Receiving the Following Services: No data recorded  Determination of Need: Emergent (2 hours)   Options For Referral: No data recorded    CCA Biopsychosocial Intake/Chief Complaint:  No data recorded Current Symptoms/Problems: No data recorded  Patient Reported Schizophrenia/Schizoaffective Diagnosis in Past: No   Strengths: Patient is able to communicate his needs  Preferences: No data recorded Abilities: No data recorded  Type of Services Patient Feels are Needed: No data recorded  Initial Clinical Notes/Concerns: No data recorded  Mental Health Symptoms Depression:   Change in energy/activity; Difficulty Concentrating; Fatigue; Hopelessness; Worthlessness   Duration of Depressive symptoms:  Greater than two weeks   Mania:   None   Anxiety:    Difficulty concentrating; Fatigue; Restlessness; Worrying   Psychosis:   None   Duration of Psychotic symptoms: No data recorded  Trauma:   None   Obsessions:   None   Compulsions:    None   Inattention:   None   Hyperactivity/Impulsivity:   None   Oppositional/Defiant Behaviors:   None   Emotional Irregularity:   None   Other Mood/Personality Symptoms:  No data recorded   Mental Status Exam Appearance and self-care  Stature:   Average   Weight:   Average weight   Clothing:   Casual   Grooming:   Normal   Cosmetic use:   None   Posture/gait:   Normal   Motor activity:   Not Remarkable   Sensorium  Attention:   Normal   Concentration:   Normal   Orientation:   X5   Recall/memory:   Normal   Affect and Mood  Affect:   Appropriate   Mood:   Depressed   Relating  Eye contact:   Normal   Facial expression:   Responsive   Attitude toward examiner:   Cooperative   Thought and Language  Speech flow:  Clear and Coherent   Thought content:   Appropriate to Mood and Circumstances   Preoccupation:   None   Hallucinations:   None   Organization:  No data recorded  [employer] of Knowledge:   Fair   Intelligence:   Average  Abstraction:   Normal   Judgement:   Good   Reality Testing:   Adequate   Insight:   Good   Decision Making:   Normal   Social Functioning  Social Maturity:   Responsible   Social Judgement:   Normal   Stress  Stressors:   Housing; Office manager Ability:   Exhausted   Skill Deficits:   None   Supports:   Support needed     Religion: Religion/Spirituality Are You A Religious Person?: No  Leisure/Recreation: Leisure / Recreation Do You Have Hobbies?: No  Exercise/Diet: Exercise/Diet Do You Exercise?: No Have You Gained or Lost A Significant Amount of Weight in the Past Six Months?: No Do You Follow a Special Diet?: No Do You Have Any Trouble Sleeping?: No   CCA Employment/Education Employment/Work Situation: Employment / Work Situation Employment Situation: Unemployed Patient's Job has Been Impacted by Current Illness:  No Has Patient ever Been in Equities trader?: No  Education: Education Is Patient Currently Attending School?: No Did You Have An Individualized Education Program (IIEP): No Did You Have Any Difficulty At [employer]?: No Patient's Education Has Been Impacted by Current Illness: No   CCA Family/Childhood History Family and Relationship History: Family history Marital status: Single Divorced, when?: Unknown What types of issues is patient dealing with in the relationship?: No none issues with a relationship Does patient have children?:  (Unknown)  Childhood History:  Childhood History Did patient suffer any verbal/emotional/physical/sexual abuse as a child?: No Did patient suffer from severe childhood neglect?: No Has patient ever been sexually abused/assaulted/raped as an adolescent or adult?: No Was the patient ever a victim of a crime or a disaster?: No Witnessed domestic violence?: No Has patient been affected by domestic violence as an adult?: No  Child/Adolescent Assessment:     CCA Substance Use Alcohol/Drug Use: Alcohol / Drug Use Pain Medications: see mar Prescriptions: see mar Over the Counter: see mar History of alcohol / drug use?: No history of alcohol / drug abuse                         ASAM's:  Six Dimensions of Multidimensional Assessment  Dimension 1:  Acute Intoxication and/or Withdrawal Potential:      Dimension 2:  Biomedical Conditions and Complications:      Dimension 3:  Emotional, Behavioral, or Cognitive Conditions and Complications:     Dimension 4:  Readiness to Change:     Dimension 5:  Relapse, Continued use, or Continued Problem Potential:     Dimension 6:  Recovery/Living Environment:     ASAM Severity Score:    ASAM Recommended Level of Treatment:     Substance use Disorder (SUD)    Recommendations for Services/Supports/Treatments:    DSM5 Diagnoses: Patient Active Problem List   Diagnosis Date Noted   Suicidal ideation  11/17/2022   Hyperkalemia 02/11/2022   DDD (degenerative disc disease), lumbar 02/11/2022   AKI (acute kidney injury) (HCC) 02/11/2022   Hyponatremia 02/11/2022   Major depressive disorder, recurrent severe without psychotic features (HCC) 01/04/2016   Tobacco use disorder 01/04/2016   CONSTIPATION 04/22/2010   ABDOMINAL PAIN 04/22/2010   LUMBAR RADICULOPATHY 08/05/2009   HYPERLIPIDEMIA 10/25/2008   DEPRESSION 10/25/2008   MIGRAINE, COMMON 10/25/2008   HTN (hypertension) 10/25/2008   HEMORRHOIDS 10/25/2008   DENTAL CARIES 10/25/2008   BLOOD IN STOOL 10/25/2008   BACK PAIN 10/25/2008   CARDIAC MURMUR 10/25/2008    Patient Centered  Plan: Patient is on the following Treatment Plan(s):  Depression   Referrals to Alternative Service(s): Referred to Alternative Service(s):   Place:   Date:   Time:    Referred to Alternative Service(s):   Place:   Date:   Time:    Referred to Alternative Service(s):   Place:   Date:   Time:    Referred to Alternative Service(s):   Place:   Date:   Time:      @BHCOLLABOFCARE @  [employer], LCAS-A

## 2022-12-11 NOTE — BH Assessment (Signed)
TTS spoke with [employer] [phone]) for patient to go to the their facility. Was informed the patient will need to call himself. Spoke with patient's nurse and she provided the patient with the phone and he completed his phone "verbal screening."  TTS followed up with [facility] [phone]), and was informed the patient can come to their facility.  Updated the patient's nurse.   Address: [facility]  [address] [place], [place] [address]

## 2022-12-11 NOTE — Discharge Instructions (Signed)
Follow-up with your regular psychiatrist.  Return to the ER for any new or worsening symptoms that concern you. °

## 2022-12-11 NOTE — Consult Note (Signed)
[facility] Face-to-Face Psychiatry Consult   Reason for Consult:  suicidal ideations Referring Physician:  EDP Patient Identification: Noah Avery MRN:  [id number] Principal Diagnosis: Adjustment disorder with mixed anxiety and depressed mood Diagnosis:  Principal Problem:   Adjustment disorder with mixed anxiety and depressed mood   Total Time spent with patient: 45 minutes  Subjective:   Noah Avery is a 52 y.o. male patient admitted with suicidal ideations.  HPI:  52 yo male who presented with suicidal ideations related to stressors.  On assessment, he stated he was "pretty good".  In the last nine months, he lost his job and vehicle with living at the [facility] with no money to return there.  This has increased his depression to a moderate/high level with fleeting, passive suicidal ideations at times.  Thoughts of "nobody needs me, people would be better off without me around" during this periods.  No plan or intent or past suicide attempts, 2 past hospitalizations.  Anxiety is moderate related to excessive worry over his finances and housing.  Denies hallucinations, paranoia, homicidal ideations, and recent substance abuse besides nicotine use.  His desire is to get to the [facility] to get social assistance, TTS working on this.  Psych cleared.  Past Psychiatric History: depression, anxiety  Risk to Self:  none Risk to Others:  none Prior Inpatient Therapy:  two times Prior Outpatient Therapy:  [facility]  Past Medical History:  Past Medical History:  Diagnosis Date   Depression    H/O degenerative disc disease     Past Surgical History:  Procedure Laterality Date   BACK SURGERY     Family History: History reviewed. No pertinent family history. Family Psychiatric  History: none Social History:  Social History   Substance and Sexual Activity  Alcohol Use Yes   Comment: occasional     Social History   Substance and Sexual Activity  Drug Use No    Social History    Socioeconomic History   Marital status: Divorced    Spouse name: Not on file   Number of children: Not on file   Years of education: Not on file   Highest education level: Not on file  Occupational History   Not on file  Tobacco Use   Smoking status: Every Day    Packs/day: 1.00    Years: 15.00    Additional pack years: 0.00    Total pack years: 15.00    Types: Cigarettes    Passive exposure: Current   Smokeless tobacco: Never  Vaping Use   Vaping Use: Never used  Substance and Sexual Activity   Alcohol use: Yes    Comment: occasional   Drug use: No   Sexual activity: Not on file  Other Topics Concern   Not on file  Social History Narrative   Not on file   Social Determinants of Health   Financial Resource Strain: Not on file  Food Insecurity: Food Insecurity Present (11/17/2022)   Hunger Vital Sign    Worried About Running Out of Food in the Last Year: Often true    Ran Out of Food in the Last Year: Often true  Transportation Needs: Unmet Transportation Needs (11/17/2022)   PRAPARE - Administrator, Civil Service (Medical): Yes    Lack of Transportation (Non-Medical): Yes  Physical Activity: Not on file  Stress: Not on file  Social Connections: Not on file   Additional Social History:    Allergies:   Allergies  Allergen Reactions   Silver Nitrate     Labs:  Results for orders placed or performed during the hospital encounter of 12/11/22 (from the past 48 hour(s))  Comprehensive metabolic panel     Status: Abnormal   Collection Time: 12/11/22 12:04 AM  Result Value Ref Range   Sodium 137 135 - 145 mmol/L   Potassium 3.6 3.5 - 5.1 mmol/L   Chloride 102 98 - 111 mmol/L   CO2 25 22 - 32 mmol/L   Glucose, Bld 115 (H) 70 - 99 mg/dL    Comment: Glucose reference range applies only to samples taken after fasting for at least 8 hours.   BUN 16 6 - 20 mg/dL   Creatinine, Ser 1.61 (H) 0.61 - 1.24 mg/dL   Calcium 8.9 8.9 - 09.6 mg/dL   Total  Protein 7.0 6.5 - 8.1 g/dL   Albumin 4.3 3.5 - 5.0 g/dL   AST 27 15 - 41 U/L   ALT 19 0 - 44 U/L   Alkaline Phosphatase 73 38 - 126 U/L   Total Bilirubin 0.7 0.3 - 1.2 mg/dL   GFR, Estimated >04 >54 mL/min    Comment: (NOTE) Calculated using the CKD-EPI Creatinine Equation (2021)    Anion gap 10 5 - 15    Comment: Performed at [facility], [address], [place], [place] [address]  Ethanol     Status: None   Collection Time: 12/11/22 12:04 AM  Result Value Ref Range   Alcohol, Ethyl (B) <10 <10 mg/dL    Comment: (NOTE) Lowest detectable limit for serum alcohol is 10 mg/dL.  For medical purposes only. Performed at [facility], [address] [place], [place] [address]   Salicylate level     Status: Abnormal   Collection Time: 12/11/22 12:04 AM  Result Value Ref Range   Salicylate Lvl <7.0 (L) 7.0 - 30.0 mg/dL    Comment: Performed at [facility], [address] [place], [place] [address]  Acetaminophen level     Status: Abnormal   Collection Time: 12/11/22 12:04 AM  Result Value Ref Range   Acetaminophen (Tylenol), Serum <10 (L) 10 - 30 ug/mL    Comment: (NOTE) Therapeutic concentrations vary significantly. A range of 10-30 ug/mL  may be an effective concentration for many patients. However, some  are best treated at concentrations outside of this range. Acetaminophen concentrations >150 ug/mL at 4 hours after ingestion  and >50 ug/mL at 12 hours after ingestion are often associated with  toxic reactions.  Performed at [facility], [address] [place], [place] [address]   cbc     Status: None   Collection Time: 12/11/22 12:04 AM  Result Value Ref Range   WBC 8.8 4.0 - 10.5 K/uL   RBC 4.42 4.22 - 5.81 MIL/uL   Hemoglobin 13.9 13.0 - 17.0 g/dL   HCT 57.8 46.9 - 62.9 %   MCV 91.6 80.0 - 100.0 fL   MCH 31.4 26.0 - 34.0 pg   MCHC 34.3 30.0 - 36.0 g/dL   RDW 52.8 41.3 - 24.4 %   Platelets 236 150 - 400 K/uL   nRBC 0.0  0.0 - 0.2 %    Comment: Performed at [facility], [address], [place], [place] [address]  Urine Drug Screen, Qualitative     Status: None   Collection Time: 12/11/22 12:05 AM  Result Value Ref Range   Tricyclic, Ur Screen NONE DETECTED NONE DETECTED   Amphetamines, Ur  Screen NONE DETECTED NONE DETECTED   MDMA (Ecstasy)Ur Screen NONE DETECTED NONE DETECTED   Cocaine Metabolite,Ur [place] NONE DETECTED NONE DETECTED   Opiate, Ur Screen NONE DETECTED NONE DETECTED   Phencyclidine (PCP) Ur S NONE DETECTED NONE DETECTED   Cannabinoid 50 Ng, Ur [place] NONE DETECTED NONE DETECTED   Barbiturates, Ur Screen NONE DETECTED NONE DETECTED   Benzodiazepine, Ur Scrn NONE DETECTED NONE DETECTED   Methadone Scn, Ur NONE DETECTED NONE DETECTED    Comment: (NOTE) Tricyclics + metabolites, urine    Cutoff 1000 ng/mL Amphetamines + metabolites, urine  Cutoff 1000 ng/mL MDMA (Ecstasy), urine              Cutoff 500 ng/mL Cocaine Metabolite, urine          Cutoff 300 ng/mL Opiate + metabolites, urine        Cutoff 300 ng/mL Phencyclidine (PCP), urine         Cutoff 25 ng/mL Cannabinoid, urine                 Cutoff 50 ng/mL Barbiturates + metabolites, urine  Cutoff 200 ng/mL Benzodiazepine, urine              Cutoff 200 ng/mL Methadone, urine                   Cutoff 300 ng/mL  The urine drug screen provides only a preliminary, unconfirmed analytical test result and should not be used for non-medical purposes. Clinical consideration and professional judgment should be applied to any positive drug screen result due to possible interfering substances. A more specific alternate chemical method must be used in order to obtain a confirmed analytical result. Gas chromatography / mass spectrometry (GC/MS) is the preferred confirm atory method. Performed at [facility], [address] [place], [place] [address]     No current facility-administered medications for this encounter.    Current Outpatient Medications  Medication Sig Dispense Refill   FLUoxetine (PROZAC) 20 MG capsule Take 1 capsule (20 mg total) by mouth daily. 30 capsule 1   hydrOXYzine (ATARAX) 25 MG tablet Take 1 tablet (25 mg total) by mouth 3 (three) times daily as needed for anxiety. 60 tablet 1   lidocaine (LIDODERM) 5 % Place 1 patch onto the skin daily. Remove & Discard patch within 12 hours or as directed by MD 30 patch 1   methocarbamol (ROBAXIN) 750 MG tablet Take 1 tablet (750 mg total) by mouth every 6 (six) hours as needed for muscle spasms. 60 tablet 1   mirtazapine (REMERON) 15 MG tablet Take 1 tablet (15 mg total) by mouth at bedtime. 30 tablet 1   naproxen (NAPROSYN) 500 MG tablet Take 1 tablet (500 mg total) by mouth 2 (two) times daily as needed for moderate pain. 60 tablet 1   traZODone (DESYREL) 100 MG tablet Take 1 tablet (100 mg total) by mouth at bedtime as needed for sleep. 30 tablet 1   nicotine (NICODERM CQ - DOSED IN MG/24 HOURS) 21 mg/24hr patch Place 1 patch (21 mg total) onto the skin daily. 28 patch 1   nicotine polacrilex (NICORETTE) 2 MG gum Take 1 each (2 mg total) by mouth as needed for smoking cessation. 100 tablet 1    Musculoskeletal: Strength & Muscle Tone: within normal limits Gait & Station: normal Patient leans: N/A  Psychiatric Specialty Exam: Physical Exam Vitals and nursing note reviewed.  Constitutional:      Appearance: Normal appearance.  HENT:  Head: Normocephalic.     Nose: Nose normal.  Pulmonary:     Effort: Pulmonary effort is normal.  Musculoskeletal:        General: Normal range of motion.  Neurological:     General: No focal deficit present.     Mental Status: He is alert and oriented to person, place, and time.  Psychiatric:        Attention and Perception: Attention and perception normal.        Mood and Affect: Mood is anxious and depressed.        Speech: Speech normal.        Behavior: Behavior normal. Behavior is  cooperative.        Thought Content: Thought content normal.        Cognition and Memory: Cognition and memory normal.        Judgment: Judgment normal.     Review of Systems  Psychiatric/Behavioral:  Positive for depression.   All other systems reviewed and are negative.   Blood pressure 118/82, pulse 89, temperature 98.7 F (37.1 C), resp. rate 16, height 5\' 10"  (1.778 m), weight 76.7 kg, SpO2 95 %.Body mass index is 24.25 kg/m.  General Appearance: Casual  Eye Contact:  Good  Speech:  Normal Rate  Volume:  Normal  Mood:  Anxious and Depressed  Affect:  Congruent  Thought Process:  Coherent  Orientation:  Full (Time, Place, and Person)  Thought Content:  WDL and Logical  Suicidal Thoughts:  No  Homicidal Thoughts:  No  Memory:  Immediate;   Good Recent;   Good Remote;   Good  Judgement:  Good  Insight:  Good  Psychomotor Activity:  Normal  Concentration:  Concentration: Good and Attention Span: Good  Recall:  Good  Fund of Knowledge:  Good  Language:  Good  Akathisia:  No  Handed:  Right  AIMS (if indicated):     Assets:  Leisure Time Physical Health Resilience  ADL's:  Intact  Cognition:  WNL  Sleep:        Physical Exam: Physical Exam Vitals and nursing note reviewed.  Constitutional:      Appearance: Normal appearance.  HENT:     Head: Normocephalic.     Nose: Nose normal.  Pulmonary:     Effort: Pulmonary effort is normal.  Musculoskeletal:        General: Normal range of motion.  Neurological:     General: No focal deficit present.     Mental Status: He is alert and oriented to person, place, and time.  Psychiatric:        Attention and Perception: Attention and perception normal.        Mood and Affect: Mood is anxious and depressed.        Speech: Speech normal.        Behavior: Behavior normal. Behavior is cooperative.        Thought Content: Thought content normal.        Cognition and Memory: Cognition and memory normal.        Judgment:  Judgment normal.    Review of Systems  Psychiatric/Behavioral:  Positive for depression.   All other systems reviewed and are negative.  Blood pressure 118/82, pulse 89, temperature 98.7 F (37.1 C), resp. rate 16, height 5\' 10"  (1.778 m), weight 76.7 kg, SpO2 95 %. Body mass index is 24.25 kg/m.  Treatment Plan Summary: Adjustment disorder with mixed disturbance of depression and anxiety: Prozac 20 mg daily  Anxiety: Hydroxyzine 25 mg TID PRN  Insomnia: Trazodone 100 mg daily PRN at bedtime  Disposition: No evidence of imminent risk to self or others at present.   Patient does not meet criteria for psychiatric inpatient admission. Supportive therapy provided about ongoing stressors.  Nanine Means, NP 12/11/2022 12:39 PM

## 2022-12-11 NOTE — ED Notes (Signed)
Pt voluntarily dressed out in hospital provided wine colored scrubs with non slip footwear.  

## 2022-12-11 NOTE — ED Provider Notes (Signed)
-----------------------------------------   4:03 PM on 12/11/2022 -----------------------------------------  Patient has a bed at the [facility].  He is stable for discharge.   Dionne Bucy, MD 12/11/22 [phone]

## 2022-12-11 NOTE — ED Notes (Signed)
Patient is resting at this time. Breakfast tray has not came yet.

## 2022-12-11 NOTE — ED Notes (Signed)
Called [employer] again with no answer.

## 2022-12-11 NOTE — ED Notes (Signed)
Spoke with Mellody Dance at [facility] who states that they do not do phone interviews for intake. Psych made aware.

## 2022-12-11 NOTE — ED Provider Notes (Signed)
Emergency Medicine Observation Re-evaluation Note  Noah Avery is a 52 y.o. male, seen on rounds today.  Pt initially presented to the ED for complaints of Suicidal  Currently, the patient is resting comfortably.  Physical Exam  BP 112/74   Pulse (!) 112   Temp 98.2 F (36.8 C)   Resp 18   Ht 5\' 10"  (1.778 m)   Wt 76.7 kg   SpO2 97%   BMI 24.25 kg/m  General: No acute distress Cardiac: Well-perfused extremities Lungs: No respiratory distress Psych: Appropriate mood and affect  ED Course / MDM  EKG:   I have reviewed the labs performed to date as well as medications administered while in observation.  Recent changes in the last 24 hours include none.  Plan  Current plan is for placement.   Merwyn Katos, MD 12/11/22 2329

## 2022-12-11 NOTE — ED Notes (Signed)
[facility], went to voicemail. Will retry at later time.

## 2022-12-11 NOTE — ED Provider Notes (Addendum)
[facility] Provider Note    Event Date/Time   First MD Initiated Contact with Patient 12/11/22 [phone]     (approximate)   History   Suicidal   HPI  Noah Avery is a 52 y.o. male who presents to the ED voluntarily seeking behavioral medicine evaluation for depression with suicidal ideation without plan.  Patient reports multiple life stressors, most recently being homeless.  Denies HI/AH/VH.  Voices no medical complaints.     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 Authorizing Provider  FLUoxetine (PROZAC) 20 MG capsule Take 1 capsule (20 mg total) by mouth daily. 12/01/22  Yes Clapacs, Jackquline Denmark, MD  hydrOXYzine (ATARAX) 25 MG tablet Take 1 tablet (25 mg total) by mouth 3 (three) times daily as needed for anxiety. 12/01/22  Yes Clapacs, Jackquline Denmark, MD  lidocaine (LIDODERM) 5 % Place 1 patch onto the skin daily. Remove & Discard patch within 12 hours or as directed by MD  12/01/22  Yes Clapacs, Jackquline Denmark, MD  methocarbamol (ROBAXIN) 750 MG tablet Take 1 tablet (750 mg total) by mouth every 6 (six) hours as needed for muscle spasms. 12/01/22  Yes Clapacs, Jackquline Denmark, MD  mirtazapine (REMERON) 15 MG tablet Take 1 tablet (15 mg total) by mouth at bedtime. 12/01/22  Yes Clapacs, Jackquline Denmark, MD  naproxen (NAPROSYN) 500 MG tablet Take 1 tablet (500 mg total) by mouth 2 (two) times daily as needed for moderate pain. 12/01/22  Yes Clapacs, Jackquline Denmark, MD  traZODone (DESYREL) 100 MG tablet Take 1 tablet (100 mg total) by mouth at bedtime as needed for sleep. 12/01/22  Yes Clapacs, Jackquline Denmark, MD  nicotine (NICODERM CQ - DOSED IN MG/24 HOURS) 21 mg/24hr patch Place 1 patch (21 mg total) onto the skin daily. 12/01/22   Clapacs, Jackquline Denmark, MD  nicotine polacrilex (NICORETTE) 2 MG gum Take 1 each (2 mg total) by mouth as needed for smoking cessation. 12/01/22   Clapacs, Jackquline Denmark, MD     Allergies  Silver nitrate   Family History  History reviewed. No pertinent family history.   Physical Exam  Triage Vital Signs: ED Triage Vitals [12/11/22 0000]  Enc Vitals Group     BP 112/74     Pulse Rate (!) 112     Resp 18     Temp 98.2 F (36.8 C)     Temp src      SpO2 97 %  Weight 169 lb (76.7 kg)     Height 5\' 10"  (1.778 m)     Head Circumference      Peak Flow      Pain Score      Pain Loc      Pain Edu?      Excl. in GC?     Updated Vital Signs: BP 112/74   Pulse (!) 112   Temp 98.2 F (36.8 C)   Resp 18   Ht 5\' 10"  (1.778 m)   Wt 76.7 kg   SpO2 97%   BMI 24.25 kg/m    General: Awake, no distress.  CV:  RRR.  Good peripheral perfusion.  Resp:  Normal effort.  CTAB. Abd:  No distention.  Other:  Cooperative.   ED Results / Procedures / Treatments  Labs (all labs ordered are listed, but only abnormal results are displayed) Labs Reviewed  COMPREHENSIVE METABOLIC PANEL - Abnormal; Notable for the following components:      Result Value   Glucose, Bld 115 (*)    Creatinine, Ser  1.34 (*)    All other components within normal limits  SALICYLATE LEVEL - Abnormal; Notable for the following components:   Salicylate Lvl <7.0 (*)    All other components within normal limits  ACETAMINOPHEN LEVEL - Abnormal; Notable for the following components:   Acetaminophen (Tylenol), Serum <10 (*)    All other components within normal limits  ETHANOL  CBC  URINE DRUG SCREEN, QUALITATIVE ([facility] ONLY)     EKG  None  RADIOLOGY None   Official radiology report(s): No results found.   PROCEDURES:  Critical Care performed: No  Procedures   MEDICATIONS ORDERED IN ED: Medications - No data to display   IMPRESSION / MDM / ASSESSMENT AND PLAN / ED COURSE  I reviewed the triage vital signs and the nursing notes.                             52 year old male presenting with depression with suicidal ideation without plan.  Laboratory results unremarkable.  He is medically cleared at this time for psychiatric evaluation and disposition.  Patient's presentation is most consistent with exacerbation of chronic illness.  Patient contracts for safety while in the emergency department. The patient has been placed in psychiatric observation due to the need to provide a safe environment for the patient while obtaining psychiatric consultation and evaluation, as well as ongoing medical and medication management to treat the patient's condition.  The patient has not been placed under full IVC at this time.  [id number] Patient remains in the emergency department voluntarily pending psychiatric evaluation.  FINAL CLINICAL IMPRESSION(S) / ED DIAGNOSES   Final diagnoses:  Depression, unspecified depression type     Rx / DC Orders   ED Discharge Orders     None        Note:  This document was prepared using Dragon voice recognition software and may include unintentional dictation errors.   Irean Hong, MD 12/11/22 1191    Irean Hong, MD 12/11/22 [phone]
